# Patient Record
Sex: Female | Born: 2000 | Race: Black or African American | Hispanic: No | Marital: Single | Smoking: Current some day smoker
Health system: Southern US, Community
[De-identification: ages and names within clinical notes are randomized; demographics above are authoritative.]

## PROBLEM LIST (undated history)

## (undated) DIAGNOSIS — D649 Anemia, unspecified: Secondary | ICD-10-CM

## (undated) HISTORY — PX: NO PAST SURGERIES: SHX2092

---
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
## 2019-01-08 ENCOUNTER — Ambulatory Visit: Payer: No Typology Code available for payment source | Admitting: Family Medicine

## 2019-01-14 ENCOUNTER — Ambulatory Visit: Payer: No Typology Code available for payment source | Admitting: Obstetrics & Gynecology

## 2019-01-20 ENCOUNTER — Other Ambulatory Visit: Payer: Self-pay

## 2019-01-20 ENCOUNTER — Ambulatory Visit (INDEPENDENT_AMBULATORY_CARE_PROVIDER_SITE_OTHER): Payer: No Typology Code available for payment source | Admitting: Obstetrics and Gynecology

## 2019-01-20 ENCOUNTER — Encounter: Payer: Self-pay | Admitting: Obstetrics and Gynecology

## 2019-01-20 VITALS — BP 124/68 | HR 74 | Wt 155.8 lb

## 2019-01-20 DIAGNOSIS — Z3202 Encounter for pregnancy test, result negative: Secondary | ICD-10-CM | POA: Diagnosis not present

## 2019-01-20 DIAGNOSIS — Z113 Encounter for screening for infections with a predominantly sexual mode of transmission: Secondary | ICD-10-CM

## 2019-01-20 DIAGNOSIS — Z3046 Encounter for surveillance of implantable subdermal contraceptive: Secondary | ICD-10-CM

## 2019-01-20 LAB — POCT URINE PREGNANCY: Preg Test, Ur: NEGATIVE

## 2019-01-20 MED ORDER — ETONOGESTREL 68 MG ~~LOC~~ IMPL
68.0000 mg | DRUG_IMPLANT | Freq: Once | SUBCUTANEOUS | Status: AC
  Administered 2019-01-20: 68 mg via SUBCUTANEOUS

## 2019-01-20 NOTE — Progress Notes (Signed)
Urine pregnancy negative  

## 2019-01-20 NOTE — Procedures (Signed)
Nexplanon Insertion Procedure Note Patient had Nexplanon for about two years and had it removed about two years ago b/c was in a same sex relationship and didn't need birth control, but states she wished she had left it in b/c it did regulate her periods; she states she had a qmonth period with the nexplanon  And now has irregular periods. Last one was last week of march and was about 6 days and not particularly heavy or painful and one prior was about 6 weeks before. Last sex 3 weeks ago.  Prior to the procedure being performed, the patient (or guardian) was asked to state their full name, date of birth, type of procedure being performed and the exact location of the operative site. This information was then checked against the documentation in the patient's chart. Prior to the procedure being performed, a "time out" was performed by the physician that confirmed the correct patient, procedure and site.  After informed consent was obtained, the patient's non-dominant left arm was chosen for insertion. A site was marked approximately 8 cm proximal to the medial epicondyle in the sulcus between the biceps and triceps on the inner surface. The area was cleaned with alcohol then local anesthesia was infiltrated with 3 ml of 1% lidocaine along the planned insertion track. The area was prepped with betadine. Using sterile technique the Nexplanon device was inserted per manufacturer's guidelines in the subdermal connective tissue using the standard insertion technique without difficulty. Pressure was applied and the insertion site was hemostatic. The presence of the Nexplanon was confirmed immediately after insertion by palpation by both me and the patient and by checking the tip of needle for the absence of the insert.  A pressure dressing was applied.  The patient tolerated the procedure well.  Patient told to wait a week before resuming sex and patient told about possible bleeding profile with nexplanon in  place. Pt amenable to STD screening today.  Cornelia Copa MD Attending Center for [employer] [facility])

## 2019-01-21 LAB — CERVICOVAGINAL ANCILLARY ONLY
Chlamydia: NEGATIVE
Neisseria Gonorrhea: NEGATIVE
Trichomonas: NEGATIVE

## 2019-01-21 LAB — HIV ANTIBODY (ROUTINE TESTING W REFLEX): HIV Screen 4th Generation wRfx: NONREACTIVE

## 2019-01-21 LAB — HEPATITIS C ANTIBODY: Hep C Virus Ab: <0.1 s/co ratio (ref 0.0–0.9)

## 2019-01-21 LAB — RPR: RPR Ser Ql: NONREACTIVE

## 2019-01-21 LAB — HEPATITIS B SURFACE ANTIGEN: Hepatitis B Surface Ag: NEGATIVE

## 2019-01-22 ENCOUNTER — Ambulatory Visit: Payer: No Typology Code available for payment source | Admitting: Family Medicine

## 2019-12-16 ENCOUNTER — Ambulatory Visit: Payer: No Typology Code available for payment source

## 2019-12-17 ENCOUNTER — Ambulatory Visit: Payer: No Typology Code available for payment source

## 2019-12-18 ENCOUNTER — Other Ambulatory Visit: Payer: Self-pay

## 2019-12-18 ENCOUNTER — Ambulatory Visit (INDEPENDENT_AMBULATORY_CARE_PROVIDER_SITE_OTHER): Payer: No Typology Code available for payment source | Admitting: *Deleted

## 2019-12-18 ENCOUNTER — Other Ambulatory Visit (HOSPITAL_COMMUNITY)
Admission: RE | Admit: 2019-12-18 | Discharge: 2019-12-18 | Disposition: A | Discharge status: Home / Self Care | Payer: No Typology Code available for payment source | Source: Ambulatory Visit | Attending: Family Medicine | Admitting: Family Medicine

## 2019-12-18 VITALS — BP 116/73 | HR 91

## 2019-12-18 DIAGNOSIS — N898 Other specified noninflammatory disorders of vagina: Secondary | ICD-10-CM | POA: Insufficient documentation

## 2019-12-18 NOTE — Progress Notes (Signed)
SUBJECTIVE:  19 y.o. female complains of vaginal irritation for a few days. Denies abnormal vaginal bleeding or significant pelvic pain or fever. No UTI symptoms. Denies history of known exposure to STD.  No LMP recorded.  OBJECTIVE:  She appears well, afebrile. Urine dipstick: not done.  ASSESSMENT:  Vaginal Irritation     PLAN:  GC, chlamydia, trichomonas, BVAG, CVAG probe sent to lab. Treatment: To be determined once lab results are received ROV prn if symptoms persist or worsen.

## 2019-12-19 NOTE — Progress Notes (Signed)
Patient seen and assessed by nursing staff.  Agree with documentation and plan.  

## 2019-12-22 ENCOUNTER — Other Ambulatory Visit: Payer: Self-pay | Admitting: Family Medicine

## 2019-12-22 DIAGNOSIS — N898 Other specified noninflammatory disorders of vagina: Secondary | ICD-10-CM

## 2019-12-22 LAB — CERVICOVAGINAL ANCILLARY ONLY
Bacterial Vaginitis (gardnerella): POSITIVE — AB
Candida Glabrata: NEGATIVE
Candida Vaginitis: POSITIVE — AB
Chlamydia: NEGATIVE
Comment: NEGATIVE
Comment: NEGATIVE
Comment: NEGATIVE
Comment: NEGATIVE
Comment: NEGATIVE
Comment: NORMAL
Neisseria Gonorrhea: NEGATIVE
Trichomonas: NEGATIVE

## 2019-12-22 MED ORDER — METRONIDAZOLE 500 MG PO TABS: 500.0000 mg | ORAL_TABLET | Freq: Two times a day (BID) | ORAL | 0 refills | Status: DC

## 2019-12-22 MED ORDER — FLUCONAZOLE 150 MG PO TABS: 150.0000 mg | ORAL_TABLET | Freq: Every day | ORAL | 2 refills | Status: DC

## 2020-03-31 ENCOUNTER — Other Ambulatory Visit: Payer: Self-pay

## 2020-03-31 ENCOUNTER — Emergency Department
Admission: EM | Admit: 2020-03-31 | Discharge: 2020-03-31 | Disposition: A | Discharge status: Home / Self Care | Payer: Self-pay | Attending: Emergency Medicine | Admitting: Emergency Medicine

## 2020-03-31 ENCOUNTER — Emergency Department: Payer: Self-pay

## 2020-03-31 DIAGNOSIS — N3090 Cystitis, unspecified without hematuria: Secondary | ICD-10-CM

## 2020-03-31 DIAGNOSIS — R42 Dizziness and giddiness: Secondary | ICD-10-CM

## 2020-03-31 DIAGNOSIS — R1031 Right lower quadrant pain: Secondary | ICD-10-CM

## 2020-03-31 DIAGNOSIS — R55 Syncope and collapse: Secondary | ICD-10-CM

## 2020-03-31 LAB — URINALYSIS, COMPLETE (UACMP) WITH MICROSCOPIC
Bilirubin Urine: NEGATIVE
Glucose, UA: NEGATIVE mg/dL
Hgb urine dipstick: NEGATIVE
Ketones, ur: NEGATIVE mg/dL
Leukocytes,Ua: NEGATIVE
Nitrite: POSITIVE — AB
Protein, ur: 30 mg/dL — AB
Specific Gravity, Urine: 1.032 — ABNORMAL HIGH (ref 1.005–1.030)
pH: 5 (ref 5.0–8.0)

## 2020-03-31 LAB — BASIC METABOLIC PANEL
Anion gap: 8 (ref 5–15)
BUN: 11 mg/dL (ref 6–20)
CO2: 25 mmol/L (ref 22–32)
Calcium: 8.9 mg/dL (ref 8.9–10.3)
Chloride: 104 mmol/L (ref 98–111)
Creatinine, Ser: 0.94 mg/dL (ref 0.44–1.00)
GFR calc Af Amer: >60 mL/min (ref >60)
GFR calc non Af Amer: >60 mL/min (ref >60)
Glucose, Bld: 90 mg/dL (ref 70–99)
Potassium: 3.8 mmol/L (ref 3.5–5.1)
Sodium: 137 mmol/L (ref 135–145)

## 2020-03-31 LAB — CBC
HCT: 38.8 % (ref 36.0–46.0)
Hemoglobin: 13 g/dL (ref 12.0–15.0)
MCH: 29.3 pg (ref 26.0–34.0)
MCHC: 33.5 g/dL (ref 30.0–36.0)
MCV: 87.6 fL (ref 80.0–100.0)
Platelets: 209 10*3/uL (ref 150–400)
RBC: 4.43 MIL/uL (ref 3.87–5.11)
RDW: 12.5 % (ref 11.5–15.5)
WBC: 5.5 10*3/uL (ref 4.0–10.5)
nRBC: 0 % (ref 0.0–0.2)

## 2020-03-31 LAB — HEPATIC FUNCTION PANEL
ALT: 8 U/L (ref 0–44)
AST: 14 U/L — ABNORMAL LOW (ref 15–41)
Albumin: 4.2 g/dL (ref 3.5–5.0)
Alkaline Phosphatase: 49 U/L (ref 38–126)
Bilirubin, Direct: 0.1 mg/dL (ref 0.0–0.2)
Indirect Bilirubin: 0.8 mg/dL (ref 0.3–0.9)
Total Bilirubin: 0.9 mg/dL (ref 0.3–1.2)
Total Protein: 7.1 g/dL (ref 6.5–8.1)

## 2020-03-31 LAB — HCG, QUANTITATIVE, PREGNANCY: hCG, Beta Chain, Quant, S: <1 m[IU]/mL (ref <5)

## 2020-03-31 LAB — LIPASE, BLOOD: Lipase: 28 U/L (ref 11–51)

## 2020-03-31 MED ORDER — SODIUM CHLORIDE 0.9% FLUSH: 3.0000 mL | Freq: Once | INTRAVENOUS | Status: DC

## 2020-03-31 MED ORDER — ONDANSETRON 4 MG PO TBDP
4.0000 mg | ORAL_TABLET | Freq: Three times a day (TID) | ORAL | 0 refills | Status: DC | PRN
Start: 2020-03-31 — End: 2020-04-08

## 2020-03-31 MED ORDER — SODIUM CHLORIDE 0.9 % IV BOLUS
1000.0000 mL | Freq: Once | INTRAVENOUS | Status: AC
  Administered 2020-03-31: 1000 mL via INTRAVENOUS

## 2020-03-31 MED ORDER — ACETAMINOPHEN 500 MG PO TABS
1000.0000 mg | ORAL_TABLET | Freq: Once | ORAL | Status: AC
  Administered 2020-03-31: 1000 mg via ORAL
  Filled 2020-03-31: qty 2

## 2020-03-31 MED ORDER — IOHEXOL 300 MG/ML  SOLN
100.0000 mL | Freq: Once | INTRAMUSCULAR | Status: AC | PRN
  Administered 2020-03-31: 100 mL via INTRAVENOUS

## 2020-03-31 MED ORDER — CEPHALEXIN 250 MG PO CAPS: 250.0000 mg | ORAL_CAPSULE | Freq: Four times a day (QID) | ORAL | 0 refills | Status: AC

## 2020-03-31 MED ORDER — ONDANSETRON HCL 4 MG/2ML IJ SOLN
4.0000 mg | Freq: Once | INTRAMUSCULAR | Status: AC
  Administered 2020-03-31: 4 mg via INTRAVENOUS
  Filled 2020-03-31: qty 2

## 2020-03-31 NOTE — ED Provider Notes (Signed)
[facility] Emergency Department Provider Note  ____________________________________________   First MD Initiated Contact with Patient 03/31/20 1329     (approximate)  I have reviewed the triage vital signs and the nursing notes.   HISTORY  Chief Complaint Near Syncope    HPI Susan [name] is a 19 y.o. female who comes in for home for abdominal pain and near syncope.  Patient reports having some lower abdominal pain mostly on the right lower side that is intermittent, moderate, nothing makes it better, nothing makes it worse.  She denies ever having any surgeries on her abdomen before.  She states that she has had some associated nausea, vomiting, feeling dehydrated.  This is led her to have some episodes of dizziness where she feels like she is about to pass out.  She denies any shortness of breath or chest pain with it.  Occasionally has some palpitations.  She states that she is not missed any periods.  She denies any vaginal discharge or dysuria.       History reviewed. No pertinent past medical history.  Patient Active Problem List   Diagnosis Date Noted  . Complete miscarriage 12/08/2015    History reviewed. No pertinent surgical history.  Prior to Admission medications   Medication Sig Start Date End Date Taking? Authorizing Provider  diphenhydrAMINE (BENADRYL) 50 MG tablet Take 1 tablet (50 mg total) by mouth at bedtime as needed for itching. 12/28/17   Merrily Brittle, MD  etonogestrel (NEXPLANON) 68 MG IMPL implant 1 each once by Subdermal route.    [provider]  fluconazole (DIFLUCAN) 150 MG tablet Take 1 tablet (150 mg total) by mouth daily. Repeat in 24 hours if needed 12/22/19   Reva Bores, MD  metroNIDAZOLE (FLAGYL) 500 MG tablet Take 1 tablet (500 mg total) by mouth 2 (two) times daily. 12/22/19   Reva Bores, MD  naproxen (NAPROSYN) 500 MG tablet Take 1 tablet (500 mg total) by mouth 2 (two) times daily with a meal. As  needed for pain Patient not taking: Reported on 01/20/2019 12/29/15   Anyanwu, Jethro Bastos, MD    Allergies Benadryl [diphenhydramine] and Coconut flavor  Family History  Problem Relation Age of Onset  . Cancer Mother 33       ovarian cancer  . Asthma Mother   . Hypertension Mother   . Epilepsy Mother   . High Cholesterol Mother   . Heart disease Father   . Hypertension Father   . Thyroid disease Maternal Grandmother   . Cancer Maternal Grandmother   . Heart disease Maternal Grandfather   . Epilepsy Maternal Grandfather   . High Cholesterol Maternal Grandfather   . Stroke Maternal Grandfather   . Hypertension Maternal Grandfather   . Hyperlipidemia Paternal Grandmother   . Hypertension Paternal Grandmother   . Cancer Paternal Grandmother   . Heart disease Paternal Grandmother     Social History Social History   Tobacco Use  . Smoking status: Never Smoker  . Smokeless tobacco: Never Used  Substance Use Topics  . Alcohol use: No  . Drug use: No      Review of Systems Constitutional: No fever/chills, dizziness Eyes: No visual changes. ENT: No sore throat. Cardiovascular: Denies chest pain. Respiratory: Denies shortness of breath. Gastrointestinal: Positive abdominal pain, nausea, vomiting. Genitourinary: Negative for dysuria. Musculoskeletal: Negative for back pain. Skin: Negative for rash. Neurological: Positive headache, no focal weakness or numbness. All other ROS negative ____________________________________________   PHYSICAL EXAM:  VITAL SIGNS: ED Triage Vitals  Enc Vitals Group     BP 03/31/20 1239 110/75     Pulse Rate 03/31/20 1239 86     Resp 03/31/20 1239 18     Temp 03/31/20 1239 98.7 F (37.1 C)     Temp Source 03/31/20 1239 Oral     SpO2 03/31/20 1239 99 %     Weight 03/31/20 1240 163 lb (73.9 kg)     Height 03/31/20 1240 5\' 8"  (1.727 m)     Head Circumference --      Peak Flow --      Pain Score 03/31/20 1239 5     Pain Loc --       Pain Edu? --      Excl. in GC? --     Constitutional: Alert and oriented. Well appearing and in no acute distress. Eyes: Conjunctivae are normal. EOMI. Head: Atraumatic. Nose: No congestion/rhinnorhea. Mouth/Throat: Mucous membranes are moist.   Neck: No stridor. Trachea Midline. FROM Cardiovascular: Normal rate, regular rhythm. Grossly normal heart sounds.  Good peripheral circulation. Respiratory: Normal respiratory effort.  No retractions. Lungs CTAB. Gastrointestinal: Soft but tender mostly in the right lower quadrant.  No distention. No abdominal bruits.  Musculoskeletal: No lower extremity tenderness nor edema.  No joint effusions. Neurologic:  Normal speech and language. No gross focal neurologic deficits are appreciated.  Cranial 2-12 are intact.  Walks with a steady gait. Skin:  Skin is warm, dry and intact. No rash noted. Psychiatric: Mood and affect are normal. Speech and behavior are normal. GU: Deferred   ____________________________________________   LABS (all labs ordered are listed, but only abnormal results are displayed)  Labs Reviewed  BASIC METABOLIC PANEL  CBC  URINALYSIS, COMPLETE (UACMP) WITH MICROSCOPIC  HCG, QUANTITATIVE, PREGNANCY  HEPATIC FUNCTION PANEL  LIPASE, BLOOD  CBG MONITORING, ED  POC URINE PREG, ED   ____________________________________________   ED ECG REPORT I, 06/01/20, the attending physician, personally viewed and interpreted this ECG.  Sinus rate of 79, no ST elevation, T wave inversion in lead III, normal intervals, sinus arrhythmia ____________________________________________  RADIOLOGY Concha Se, personally viewed and evaluated these images (plain radiographs) as part of my medical decision making, as well as reviewing the written report by the radiologist.  ED MD interpretation: Pending  Official radiology report(s): No results found.  ____________________________________________   PROCEDURES  Procedure(s)  performed (including Critical Care):  Procedures   ____________________________________________   INITIAL IMPRESSION / ASSESSMENT AND PLAN / ED COURSE  Susan [name] was evaluated in Emergency Department on 03/31/2020 for the symptoms described in the history of present illness. She was evaluated in the context of the global COVID-19 pandemic, which necessitated consideration that the patient might be at risk for infection with the SARS-CoV-2 virus that causes COVID-19. Institutional protocols and algorithms that pertain to the evaluation of patients at risk for COVID-19 are in a state of rapid change based on information released by regulatory bodies including the CDC and federal and state organizations. These policies and algorithms were followed during the patient's care in the ED.     Patient is a very well-appearing 19 year old with normal vital signs who comes in with right lower quadrant pain, nausea, vomiting, feeling dehydrated.  Also reports a headache as well.  Patient neuro exam is normal she ambulates steadily.  Patient was tender in her right lower quadrant.  Discussed with patient the possibility ovarian versus appendicitis pathology.  We will proceed  with CT imaging to evaluate for both given lower suspicion for ovarian pathology..  Will get pregnancy test.  Will get urine to evaluate for UTI.  She denies concerns for STDs or vaginal symptoms and declines pelvic exam at this time.  She denies any shortness of breath is not tachycardic or hypoxic to suggest PE.   We will give patient Tylenol, Zofran, fluids.  Patient handed off to oncoming team pending CT scan, urine and reevaluation after treatments.  Suspect discharge home if workup is -reassuring.  ____________________________________________   FINAL CLINICAL IMPRESSION(S) / ED DIAGNOSES   Final diagnoses:  Near syncope  Dizziness  Right lower quadrant abdominal pain      MEDICATIONS GIVEN DURING THIS  VISIT:  Medications  sodium chloride flush (NS) 0.9 % injection 3 mL (3 mLs Intravenous Not Given 03/31/20 1345)  sodium chloride 0.9 % bolus 1,000 mL (has no administration in time range)  ondansetron (ZOFRAN) injection 4 mg (has no administration in time range)  acetaminophen (TYLENOL) tablet 1,000 mg (has no administration in time range)     ED Discharge Orders    None       Note:  This document was prepared using Dragon voice recognition software and may include unintentional dictation errors.   Concha Se, MD 03/31/20 [phone]

## 2020-03-31 NOTE — ED Provider Notes (Signed)
CT/abd IMPRESSION: No acute abnormality or findings to account for reported symptoms.  Patient is cleared for outpatient follow-up.   Susan Filbert, MD 03/31/20 1540

## 2020-03-31 NOTE — ED Triage Notes (Signed)
Pt arrives via POV from home for reports of nausea, vomting, dizziness, headache, and lower abdominal pain that started last night. Pt reports an episode of almost passing out at work last night. Pt states the dizziness has mostly subsided today but she still has abdominal pain, headache and nausea. Pt ambulatory from triage with steady gait in NAD, skin warm and dry.

## 2020-03-31 NOTE — Discharge Instructions (Addendum)
Urine concerning for possible UTI-- started on antibiotics. Stay hydrated. Take zofran for nausea.

## 2020-04-03 LAB — URINE CULTURE: Culture: >=100000 — AB

## 2020-04-07 ENCOUNTER — Other Ambulatory Visit (HOSPITAL_COMMUNITY)
Admission: RE | Admit: 2020-04-07 | Discharge: 2020-04-07 | Disposition: A | Discharge status: Home / Self Care | Payer: Medicaid Other | Source: Ambulatory Visit | Attending: Family Medicine | Admitting: Family Medicine

## 2020-04-07 ENCOUNTER — Ambulatory Visit: Payer: Medicaid Other

## 2020-04-07 ENCOUNTER — Other Ambulatory Visit: Payer: Self-pay

## 2020-04-07 VITALS — BP 116/77 | HR 94

## 2020-04-07 DIAGNOSIS — R1031 Right lower quadrant pain: Secondary | ICD-10-CM

## 2020-04-07 NOTE — Progress Notes (Signed)
SUBJECTIVE:  19 y.o. female complains of abdominal pain. Denies abnormal vaginal bleeding or discharge. Pt. Was treated for UTI at [facility] on 03/31/2020. Denies history of known exposure to STD.  LMP: 03/07/2020  OBJECTIVE:  She appears well, afebrile.   ASSESSMENT:  Vaginal Discharge: Denies any vaginal discharge Vaginal Odor: Denies any vaginal odor   PLAN:  GC, chlamydia, trichomonas, BVAG, CVAG probe sent to lab. Treatment: To be determined once lab results are received ROV prn if symptoms persist or worsen.

## 2020-04-08 ENCOUNTER — Ambulatory Visit: Payer: Medicaid Other

## 2020-04-08 ENCOUNTER — Other Ambulatory Visit: Payer: Self-pay | Admitting: Obstetrics & Gynecology

## 2020-04-08 DIAGNOSIS — B3731 Acute candidiasis of vulva and vagina: Secondary | ICD-10-CM

## 2020-04-08 LAB — CERVICOVAGINAL ANCILLARY ONLY
Bacterial Vaginitis (gardnerella): NEGATIVE
Candida Glabrata: NEGATIVE
Candida Vaginitis: POSITIVE — AB
Chlamydia: NEGATIVE
Comment: NEGATIVE
Comment: NEGATIVE
Comment: NEGATIVE
Comment: NEGATIVE
Comment: NEGATIVE
Comment: NORMAL
Neisseria Gonorrhea: NEGATIVE
Trichomonas: NEGATIVE

## 2020-04-08 MED ORDER — FLUCONAZOLE 150 MG PO TABS: 150.0000 mg | ORAL_TABLET | Freq: Once | ORAL | 3 refills | Status: AC

## 2020-04-10 ENCOUNTER — Emergency Department
Admission: EM | Admit: 2020-04-10 | Discharge: 2020-04-10 | Disposition: A | Discharge status: Home / Self Care | Payer: Medicaid Other | Attending: Emergency Medicine | Admitting: Emergency Medicine

## 2020-04-10 ENCOUNTER — Other Ambulatory Visit: Payer: Self-pay

## 2020-04-10 DIAGNOSIS — R1030 Lower abdominal pain, unspecified: Secondary | ICD-10-CM | POA: Insufficient documentation

## 2020-04-10 LAB — COMPREHENSIVE METABOLIC PANEL
ALT: 9 U/L (ref 0–44)
AST: 14 U/L — ABNORMAL LOW (ref 15–41)
Albumin: 4.6 g/dL (ref 3.5–5.0)
Alkaline Phosphatase: 51 U/L (ref 38–126)
Anion gap: 10 (ref 5–15)
BUN: 11 mg/dL (ref 6–20)
CO2: 24 mmol/L (ref 22–32)
Calcium: 9 mg/dL (ref 8.9–10.3)
Chloride: 106 mmol/L (ref 98–111)
Creatinine, Ser: 1.03 mg/dL — ABNORMAL HIGH (ref 0.44–1.00)
GFR calc Af Amer: >60 mL/min (ref >60)
GFR calc non Af Amer: >60 mL/min (ref >60)
Glucose, Bld: 95 mg/dL (ref 70–99)
Potassium: 3.4 mmol/L — ABNORMAL LOW (ref 3.5–5.1)
Sodium: 140 mmol/L (ref 135–145)
Total Bilirubin: 0.8 mg/dL (ref 0.3–1.2)
Total Protein: 7.7 g/dL (ref 6.5–8.1)

## 2020-04-10 LAB — URINALYSIS, COMPLETE (UACMP) WITH MICROSCOPIC
Bilirubin Urine: NEGATIVE
Glucose, UA: NEGATIVE mg/dL
Hgb urine dipstick: NEGATIVE
Ketones, ur: 5 mg/dL — AB
Nitrite: NEGATIVE
Protein, ur: 30 mg/dL — AB
Specific Gravity, Urine: 1.031 — ABNORMAL HIGH (ref 1.005–1.030)
pH: 5 (ref 5.0–8.0)

## 2020-04-10 LAB — LIPASE, BLOOD: Lipase: 37 U/L (ref 11–51)

## 2020-04-10 LAB — CBC
HCT: 38.5 % (ref 36.0–46.0)
Hemoglobin: 12.9 g/dL (ref 12.0–15.0)
MCH: 29.5 pg (ref 26.0–34.0)
MCHC: 33.5 g/dL (ref 30.0–36.0)
MCV: 88.1 fL (ref 80.0–100.0)
Platelets: 242 10*3/uL (ref 150–400)
RBC: 4.37 MIL/uL (ref 3.87–5.11)
RDW: 12.4 % (ref 11.5–15.5)
WBC: 8.1 10*3/uL (ref 4.0–10.5)
nRBC: 0 % (ref 0.0–0.2)

## 2020-04-10 LAB — POCT PREGNANCY, URINE: Preg Test, Ur: NEGATIVE

## 2020-04-10 MED ORDER — NAPROXEN 250 MG PO TABS
250.0000 mg | ORAL_TABLET | Freq: Two times a day (BID) | ORAL | 0 refills | Status: AC
Start: 2020-04-10 — End: 2020-05-10

## 2020-04-10 MED ORDER — NAPROXEN 500 MG PO TABS
250.0000 mg | ORAL_TABLET | Freq: Once | ORAL | Status: AC
  Administered 2020-04-10: 250 mg via ORAL
  Filled 2020-04-10: qty 1

## 2020-04-10 NOTE — ED Triage Notes (Signed)
Patient reports seen for same symptoms 2 weeks ago abdominal pain, with nausea headache have continued and she wants to know what is going on.  Patient in no acute distress.

## 2020-04-10 NOTE — ED Notes (Signed)
Pt unable to sign E-signature due to signature pad malfunction. Pt verbalized understanding of d/c instructions and had no additional questions or concerns for this RN or provider. Pt left with d/c instructions and gathered all personal belongings from room and removed them prior to ED departure.   

## 2020-04-10 NOTE — ED Notes (Signed)
Patient called for a room. Patient states that she has to work tonight so she needs to leave and can not stay to be seen. Patient encouraged to stay at this time to see the MD. Patient states that she will stay to be evaluated.

## 2020-04-10 NOTE — Discharge Instructions (Signed)
Your lab tests and examination today were reassuring. It appears that your urinary tract infection is clearing up. Please continue to follow-up with your doctor as needed.

## 2020-04-10 NOTE — ED Provider Notes (Signed)
[facility] Emergency Department Provider Note  ____________________________________________  Time seen: Approximately 10:19 PM  I have reviewed the triage vital signs and the nursing notes.   HISTORY  Chief Complaint Abdominal Pain    HPI Susan [name] is a 19 y.o. female with no significant past medical history who comes ED complaining of lower abdominal pain for the past week. She was seen in the ED 10 days ago, had labs and CT scan, diagnosed with cystitis and treated with Keflex. She took the Keflex, but felt that her symptoms had not adequately resolved over the weeks, so she followed up with her own doctor. Reviewed electronic medical record which shows that she had additional STI screening which was positive for bacterial vaginosis and Candida vaginitis. She was treated with Flagyl and Diflucan by her primary care clinic. She took these medicines yesterday. She is feeling better, and reports that her pain is resolved, but still feels a little bit off from normal so wanted to be reevaluated today.  No aggravating or alleviating factors, no radiating pain. No vomiting or diarrhea. No constipation. No irregular vaginal bleeding or discharge at this point. Dysuria has improved.      No past medical history on file.   Patient Active Problem List   Diagnosis Date Noted  . Complete miscarriage 12/08/2015     No past surgical history on file.   Prior to Admission medications   Medication Sig Start Date End Date Taking? Authorizing Provider  diphenhydrAMINE (BENADRYL) 50 MG tablet Take 1 tablet (50 mg total) by mouth at bedtime as needed for itching. 12/28/17   Merrily Brittle, MD  etonogestrel (NEXPLANON) 68 MG IMPL implant 1 each once by Subdermal route.    [provider]  naproxen (NAPROSYN) 250 MG tablet Take 1 tablet (250 mg total) by mouth 2 (two) times daily with a meal. 04/10/20 05/10/20  Sharman Cheek, MD     Allergies Coconut  flavor   Family History  Problem Relation Age of Onset  . Cancer Mother 56       ovarian cancer  . Asthma Mother   . Hypertension Mother   . Epilepsy Mother   . High Cholesterol Mother   . Heart disease Father   . Hypertension Father   . Thyroid disease Maternal Grandmother   . Cancer Maternal Grandmother   . Heart disease Maternal Grandfather   . Epilepsy Maternal Grandfather   . High Cholesterol Maternal Grandfather   . Stroke Maternal Grandfather   . Hypertension Maternal Grandfather   . Hyperlipidemia Paternal Grandmother   . Hypertension Paternal Grandmother   . Cancer Paternal Grandmother   . Heart disease Paternal Grandmother     Social History Social History   Tobacco Use  . Smoking status: Never Smoker  . Smokeless tobacco: Never Used  Substance Use Topics  . Alcohol use: No  . Drug use: No    Review of Systems  Constitutional:   No fever or chills.  ENT:   No sore throat. No rhinorrhea. Cardiovascular:   No chest pain or syncope. Respiratory:   No dyspnea or cough. Gastrointestinal: Positive as above for low abdominal pain without vomiting and diarrhea.  Musculoskeletal:   Negative for focal pain or swelling All other systems reviewed and are negative except as documented above in ROS and HPI.  ____________________________________________   PHYSICAL EXAM:  VITAL SIGNS: ED Triage Vitals  Enc Vitals Group     BP 04/10/20 1903 121/86  Pulse Rate 04/10/20 1903 91     Resp 04/10/20 1903 18     Temp 04/10/20 1903 98.3 F (36.8 C)     Temp Source 04/10/20 1903 Oral     SpO2 04/10/20 1903 98 %     Weight 04/10/20 1905 161 lb (73 kg)     Height 04/10/20 1905 5\' 8"  (1.727 m)     Head Circumference --      Peak Flow --      Pain Score 04/10/20 1905 0     Pain Loc --      Pain Edu? --      Excl. in GC? --     Vital signs reviewed, nursing assessments reviewed.   Constitutional:   Alert and oriented. Non-toxic appearance. Eyes:    Conjunctivae are normal. EOMI. PERRL. ENT      Head:   Normocephalic and atraumatic.      Nose:   Wearing a mask.      Mouth/Throat:   Wearing a mask.      Neck:   No meningismus. Full ROM. Hematological/Lymphatic/Immunilogical:   No cervical lymphadenopathy. Cardiovascular:   RRR. Symmetric bilateral radial and DP pulses.  No murmurs. Cap refill less than 2 seconds. Respiratory:   Normal respiratory effort without tachypnea/retractions. Breath sounds are clear and equal bilaterally. No wheezes/rales/rhonchi. Gastrointestinal:   Soft and nontender. Non distended. There is no CVA tenderness.  No rebound, rigidity, or guarding.  Musculoskeletal:   Normal range of motion in all extremities. No joint effusions.  No lower extremity tenderness.  No edema. Neurologic:   Normal speech and language.  Motor grossly intact. No acute focal neurologic deficits are appreciated.  Skin:    Skin is warm, dry and intact. No rash noted.  No petechiae, purpura, or bullae.  ____________________________________________    LABS (pertinent positives/negatives) (all labs ordered are listed, but only abnormal results are displayed) Labs Reviewed  COMPREHENSIVE METABOLIC PANEL - Abnormal; Notable for the following components:      Result Value   Potassium 3.4 (*)    Creatinine, Ser 1.03 (*)    AST 14 (*)    All other components within normal limits  URINALYSIS, COMPLETE (UACMP) WITH MICROSCOPIC - Abnormal; Notable for the following components:   Color, Urine YELLOW (*)    APPearance HAZY (*)    Specific Gravity, Urine 1.031 (*)    Ketones, ur 5 (*)    Protein, ur 30 (*)    Leukocytes,Ua TRACE (*)    Bacteria, UA RARE (*)    All other components within normal limits  LIPASE, BLOOD  CBC  POC URINE PREG, ED  POCT PREGNANCY, URINE   ____________________________________________   EKG    ____________________________________________    RADIOLOGY  No results  found.  ____________________________________________   PROCEDURES Procedures  ____________________________________________    CLINICAL IMPRESSION / ASSESSMENT AND PLAN / ED COURSE  Medications ordered in the ED: Medications  naproxen (NAPROSYN) tablet 250 mg (250 mg Oral Given 04/10/20 2218)    Pertinent labs & imaging results that were available during my care of the patient were reviewed by me and considered in my medical decision making (see chart for details).  SHERRA KIMMONS was evaluated in Emergency Department on 04/10/2020 for the symptoms described in the history of present illness. She was evaluated in the context of the global COVID-19 pandemic, which necessitated consideration that the patient might be at risk for infection with the SARS-CoV-2 virus that causes COVID-19. Institutional  protocols and algorithms that pertain to the evaluation of patients at risk for COVID-19 are in a state of rapid change based on information released by regulatory bodies including the CDC and federal and state organizations. These policies and algorithms were followed during the patient's care in the ED.   Patient is nontoxic, wants to be reevaluated today for persistent abdominal pain in the setting of recent treatment for UTI, bacterial vaginosis, Candida vaginitis. Vital signs are normal, exam is benign. Had a recent CT scan which was unremarkable. Doubt torsion or evolving appendicitis. I think she is stable for continued outpatient follow-up, NSAIDs as needed.      ____________________________________________   FINAL CLINICAL IMPRESSION(S) / ED DIAGNOSES    Final diagnoses:  Lower abdominal pain     ED Discharge Orders         Ordered    naproxen (NAPROSYN) 250 MG tablet  2 times daily with meals     Discontinue  Reprint     04/10/20 2219          Portions of this note were generated with dragon dictation software. Dictation errors may occur despite best attempts at  proofreading.   Sharman Cheek, MD 04/10/20 2221

## 2020-05-12 ENCOUNTER — Emergency Department
Admission: EM | Admit: 2020-05-12 | Discharge: 2020-05-12 | Discharge status: Against Medical Advice | Payer: Medicaid Other

## 2020-06-03 ENCOUNTER — Emergency Department: Payer: Medicaid Other

## 2020-06-03 DIAGNOSIS — L609 Nail disorder, unspecified: Secondary | ICD-10-CM | POA: Insufficient documentation

## 2020-06-03 DIAGNOSIS — Z5321 Procedure and treatment not carried out due to patient leaving prior to being seen by health care provider: Secondary | ICD-10-CM | POA: Insufficient documentation

## 2020-06-03 NOTE — ED Triage Notes (Signed)
Pt comes to ED via POV due to her right big toe nail has separated from nail bed. Pt states that 1 month ago she was walking up stairs and kicked the step causing pain and pt believes she had caused her toenail to separate from nail bed. Pt states she has treated it the last month but starting 2 weeks ago the nail bed turned green and has became more loose. Pt denies pain at the site, site is not red or swollen

## 2020-06-04 ENCOUNTER — Emergency Department
Admission: EM | Admit: 2020-06-04 | Discharge: 2020-06-04 | Disposition: A | Discharge status: Home / Self Care | Payer: Medicaid Other | Attending: Emergency Medicine | Admitting: Emergency Medicine

## 2020-08-05 ENCOUNTER — Ambulatory Visit: Payer: Medicaid Other | Admitting: Obstetrics and Gynecology

## 2020-08-11 ENCOUNTER — Encounter: Payer: Self-pay | Admitting: Family Medicine

## 2020-08-11 ENCOUNTER — Other Ambulatory Visit (HOSPITAL_COMMUNITY)
Admission: RE | Admit: 2020-08-11 | Discharge: 2020-08-11 | Disposition: A | Discharge status: Home / Self Care | Payer: Medicaid Other | Source: Ambulatory Visit | Attending: Family Medicine | Admitting: Family Medicine

## 2020-08-11 ENCOUNTER — Other Ambulatory Visit: Payer: Self-pay

## 2020-08-11 ENCOUNTER — Ambulatory Visit (INDEPENDENT_AMBULATORY_CARE_PROVIDER_SITE_OTHER): Payer: Self-pay | Admitting: Family Medicine

## 2020-08-11 VITALS — BP 115/75 | HR 71 | Wt 130.0 lb

## 2020-08-11 DIAGNOSIS — B3731 Acute candidiasis of vulva and vagina: Secondary | ICD-10-CM

## 2020-08-11 DIAGNOSIS — B373 Candidiasis of vulva and vagina: Secondary | ICD-10-CM

## 2020-08-11 MED ORDER — FLUCONAZOLE 150 MG PO TABS: 150.0000 mg | ORAL_TABLET | Freq: Once | ORAL | 3 refills | Status: AC

## 2020-08-11 NOTE — Progress Notes (Signed)
Recurrent yeast infections

## 2020-08-11 NOTE — Progress Notes (Signed)
   GYNECOLOGY PROBLEM  VISIT ENCOUNTER NOTE  Subjective:   Susan [name] is a 19 y.o. [id number] female here for a a problem GYN visit.  Current complaints: vaginal irritation-- happens every 3-4 months. Notices irritation and mild itching but minimal discharge. Denies odor. No new partners since last visit in July. Reports stinging with urination at the end. Has used the diflucan. She does wax, but has not recently. Use summers eve wipes occasionally and summer eve wash externally. Showers every 2days. No change sin soaps. No aggravating and no alleviating. Reports no provoking factors.  Denies abnormal vaginal bleeding, discharge, pelvic pain, problems with intercourse or other gynecologic concerns.    Gynecologic History No LMP recorded. Contraception: Nexplanon  Health Maintenance Due  Topic Date Due  . TETANUS/TDAP  Never done  . INFLUENZA VACCINE  Never done     The following portions of the patient's history were reviewed and updated as appropriate: allergies, current medications, past family history, past medical history, past social history, past surgical history and problem list.  Review of Systems Pertinent items are noted in HPI.   Objective:  BP 115/75   Pulse 71   Wt 130 lb (59 kg)   BMI 19.77 kg/m  Gen: well appearing, NAD HEENT: no scleral icterus CV: RR Lung: Normal WOB Ext: warm well perfused  PELVIC: White film on lower vestibule and between labia minora/majora bilaterally. Small fissure present on lower perineum.  normal appearing vaginal mucosa and cervix.  No abnormal discharge noted.  Normal uterine size, no other palpable masses, no uterine or adnexal tenderness.   Assessment and Plan:  1. Candida vaginitis Recommend establishing healthier vaginal flora Discussed cotton underwear, sleeping without underwear, use of probiotics and apple cider vinegar baths Recommended to continue to avoid waxing until treatment is complete an sx resolve.  -  fluconazole (DIFLUCAN) 150 MG tablet; Take 1 tablet (150 mg total) by mouth once for 1 dose. Can take additional dose three days later if symptoms persist  Dispense: 1 tablet; Refill: 3 - Cervicovaginal ancillary only( [facility])   Please refer to After Visit Summary for other counseling recommendations.   No follow-ups on file.  Federico Flake, MD, MPH, ABFM Attending Physician Faculty Practice- [facility]

## 2020-08-11 NOTE — Patient Instructions (Addendum)
Natural Remedies for Vaginal sx  Option #1 1 Tbsp Fractitionated Coconut Oil 10 drops of Melaleuca (Tea Tree) Oil  Mix ingredients together well.  Soak 3-4 tampons (in applicators) in that mixture until all or mostly all mixture is soaked up into the tampons.  Insert 1 saturated tampon vaginally and wear overnight for 3-4 nights.    Option #2 (sometimes to be used in conjunction with option #1) Fill tub with enough to cover lap/lower abdomen warm water.  Mix 1/2 cup of baking soda in water.  Soak in water/baking soda mixture for at least 20 minutes.  Be sure to swish water in between legs to get as much in vagina as possible.  This soak should be done after sexual intercourse and menstrual cycles.    Option#3 (can use with #1 or in place of #2) Fill tub with enough to cover lap/lower abdomen warm water. Mix 2-4 cups of apple cider vinegar into the water. Be sure to swish water in between legs to get as much in vagina as possible.  This soak should be done after sexual intercourse and menstrual cycles.    Reestablish good bacteria (Lactobacillus)! You can do this by using probiotic 1) Culturelle or Store Brand 2) Places that whole food often have supplements with health bacteria 3) Foods with natural probiotics- yogurt, kefir, kombucha, fermented cabbage like saurerkruat and kim chi  GO WHITE: Soap: UNSCENTED Dove (white box light green writing) Laundry detergent (underwear)- Dreft or [employer] unscented WHITE 100% cotton panties (NOT just cotton crouch) Sanitary napkin/panty liners: UNSCENTED.  If it doesn't SAY unscented it can have a scent/perfume    NO PERFUMES OR LOTIONS OR POTIONS in the vulvar area (may use regular [place]) Condoms: hypoallergenic only. Non dyed (no color) Toilet papers: white only Wash clothes: use a separate wash cloth. WHITE.  Wash in [place].   You can purchase Tea Tree Oil locally at:  [employer] [address] [place] [address]  [employer] [address] [place] [address]  Advise that these alternatives will not replace the need to be evaluated if symptoms persist. You will need to seek care at an OB/GYN provider.

## 2020-08-13 LAB — CERVICOVAGINAL ANCILLARY ONLY
Bacterial Vaginitis (gardnerella): NEGATIVE
Candida Glabrata: NEGATIVE
Candida Vaginitis: POSITIVE — AB
Chlamydia: NEGATIVE
Comment: NEGATIVE
Comment: NEGATIVE
Comment: NEGATIVE
Comment: NEGATIVE
Comment: NEGATIVE
Comment: NORMAL
Neisseria Gonorrhea: NEGATIVE
Trichomonas: NEGATIVE

## 2020-09-29 ENCOUNTER — Ambulatory Visit: Payer: Medicaid Other

## 2020-09-30 ENCOUNTER — Ambulatory Visit: Payer: Medicaid Other

## 2020-10-04 ENCOUNTER — Ambulatory Visit (INDEPENDENT_AMBULATORY_CARE_PROVIDER_SITE_OTHER): Payer: Self-pay | Admitting: *Deleted

## 2020-10-04 ENCOUNTER — Other Ambulatory Visit (HOSPITAL_COMMUNITY)
Admission: RE | Admit: 2020-10-04 | Discharge: 2020-10-04 | Disposition: A | Discharge status: Home / Self Care | Payer: Medicaid Other | Source: Ambulatory Visit | Attending: Obstetrics & Gynecology | Admitting: Obstetrics & Gynecology

## 2020-10-04 ENCOUNTER — Other Ambulatory Visit: Payer: Self-pay

## 2020-10-04 VITALS — BP 110/73 | HR 75

## 2020-10-04 DIAGNOSIS — B373 Candidiasis of vulva and vagina: Secondary | ICD-10-CM

## 2020-10-04 DIAGNOSIS — B3731 Acute candidiasis of vulva and vagina: Secondary | ICD-10-CM

## 2020-10-04 DIAGNOSIS — N898 Other specified noninflammatory disorders of vagina: Secondary | ICD-10-CM | POA: Diagnosis not present

## 2020-10-04 NOTE — Progress Notes (Signed)
SUBJECTIVE:  20 y.o. female complains of white vaginal discharge and irritation for few day(s). Denies abnormal vaginal bleeding or significant pelvic pain or fever. No UTI symptoms. Denies history of known exposure to STD.  No LMP recorded.  OBJECTIVE:  She appears well, afebrile. Urine dipstick: not done.  ASSESSMENT:  Vaginal Discharge  Vaginal Irritation    PLAN:  BVAG, CVAG probe sent to lab. Treatment: To be determined once lab results are received ROV prn if symptoms persist or worsen.

## 2020-10-05 NOTE — Progress Notes (Signed)
Patient was assessed and managed by nursing staff during this encounter. I have reviewed the chart and agree with the documentation and plan. I have also made any necessary editorial changes.  Jaynie Collins, MD 10/05/2020 9:22 AM

## 2020-10-06 LAB — CERVICOVAGINAL ANCILLARY ONLY
Bacterial Vaginitis (gardnerella): NEGATIVE
Candida Glabrata: NEGATIVE
Candida Vaginitis: POSITIVE — AB
Comment: NEGATIVE
Comment: NEGATIVE
Comment: NEGATIVE

## 2020-10-06 MED ORDER — FLUCONAZOLE 150 MG PO TABS: 150.0000 mg | ORAL_TABLET | Freq: Once | ORAL | 3 refills | Status: AC

## 2020-10-06 NOTE — Addendum Note (Signed)
Addended by: Jaynie Collins A on: 10/06/2020 10:19 PM   Modules accepted: Orders

## 2020-12-07 ENCOUNTER — Ambulatory Visit: Payer: Medicaid Other

## 2020-12-09 ENCOUNTER — Other Ambulatory Visit (HOSPITAL_COMMUNITY)
Admission: RE | Admit: 2020-12-09 | Discharge: 2020-12-09 | Disposition: A | Discharge status: Home / Self Care | Payer: Medicaid Other | Source: Ambulatory Visit | Attending: Obstetrics & Gynecology | Admitting: Obstetrics & Gynecology

## 2020-12-09 ENCOUNTER — Other Ambulatory Visit: Payer: Self-pay

## 2020-12-09 ENCOUNTER — Ambulatory Visit (INDEPENDENT_AMBULATORY_CARE_PROVIDER_SITE_OTHER): Payer: Self-pay

## 2020-12-09 VITALS — BP 100/69 | HR 76

## 2020-12-09 DIAGNOSIS — B3731 Acute candidiasis of vulva and vagina: Secondary | ICD-10-CM

## 2020-12-09 DIAGNOSIS — N898 Other specified noninflammatory disorders of vagina: Secondary | ICD-10-CM | POA: Insufficient documentation

## 2020-12-09 DIAGNOSIS — B373 Candidiasis of vulva and vagina: Secondary | ICD-10-CM

## 2020-12-09 NOTE — Progress Notes (Signed)
SUBJECTIVE:  20 y.o. female complains of vaginal irritation for 4 days with a white discharge.    Denies abnormal vaginal bleeding or significant pelvic pain or fever. No UTI symptoms. Denies history of known exposure to STD.  No LMP recorded.  OBJECTIVE:  She appears well, afebrile. Urine dipstick: not done.  ASSESSMENT:  Vaginal Discharge     PLAN:  GC, chlamydia, trichomonas, BVAG, CVAG probe sent to lab. Treatment: To be determined once lab results are received ROV prn if symptoms persist or worsen.

## 2020-12-09 NOTE — Progress Notes (Signed)
Patient was assessed and managed by nursing staff during this encounter. I have reviewed the chart and agree with the documentation and plan. I have also made any necessary editorial changes.  Conna Terada, MD 12/09/2020 4:33 PM 

## 2020-12-10 LAB — CERVICOVAGINAL ANCILLARY ONLY
Bacterial Vaginitis (gardnerella): NEGATIVE
Candida Glabrata: NEGATIVE
Candida Vaginitis: POSITIVE — AB
Chlamydia: NEGATIVE
Comment: NEGATIVE
Comment: NEGATIVE
Comment: NEGATIVE
Comment: NEGATIVE
Comment: NEGATIVE
Comment: NORMAL
Neisseria Gonorrhea: NEGATIVE
Trichomonas: NEGATIVE

## 2020-12-12 MED ORDER — FLUCONAZOLE 150 MG PO TABS: 150.0000 mg | ORAL_TABLET | Freq: Once | ORAL | 3 refills | Status: DC

## 2020-12-12 NOTE — Addendum Note (Signed)
Addended by: Jaynie Collins A on: 12/12/2020 11:36 AM   Modules accepted: Orders

## 2021-03-21 IMAGING — CT CT ABD-PELV W/ CM
2 of 4 series · 17 of 46 positions shown, 19 images · IV contrast (omnipaque)
Comparison: None.

CLINICAL DATA: Abdominal pain

EXAM:
CT ABDOMEN AND PELVIS WITH CONTRAST
TECHNIQUE: Multidetector CT imaging of the abdomen and pelvis was performed
using the standard protocol following bolus administration of
intravenous contrast.
CONTRAST:  100mL OMNIPAQUE IOHEXOL 300 MG/ML  SOLN

[Series 2: routine abd/pel with · axial · 0.70mm/px · z∈[-531,-121]mm · 14 of 90 slices shown, 16 images]
[im 4/90  soft-tissue]
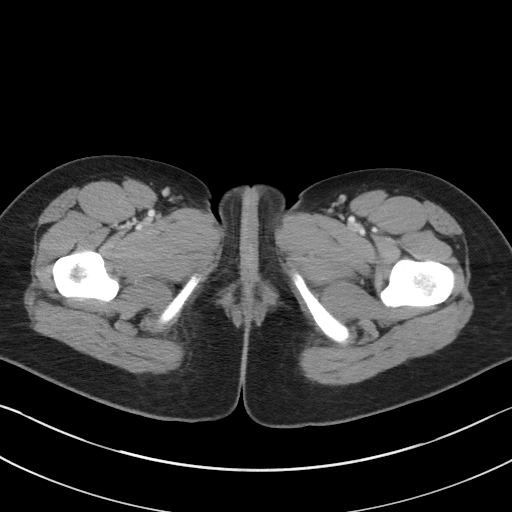
[im 4/90  bone]
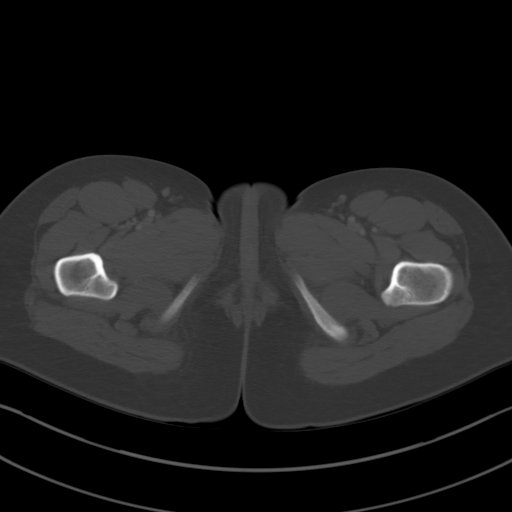
[im 12/90  soft-tissue]
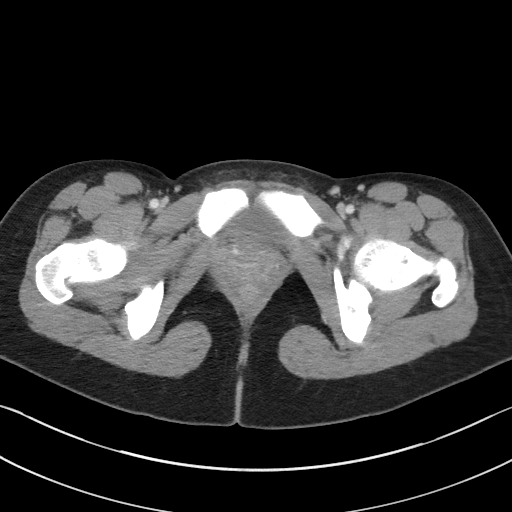
[im 19/90  soft-tissue]
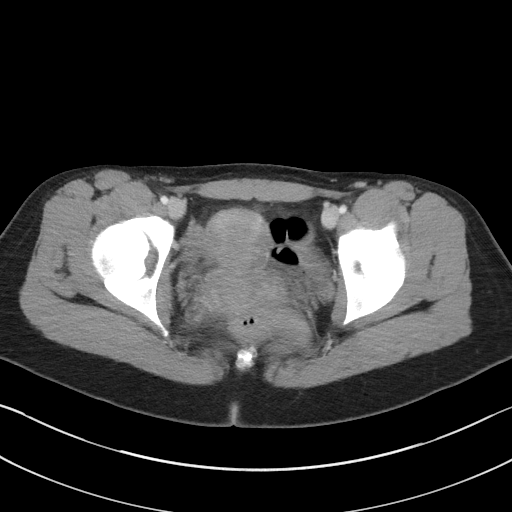
[im 23/90  soft-tissue]
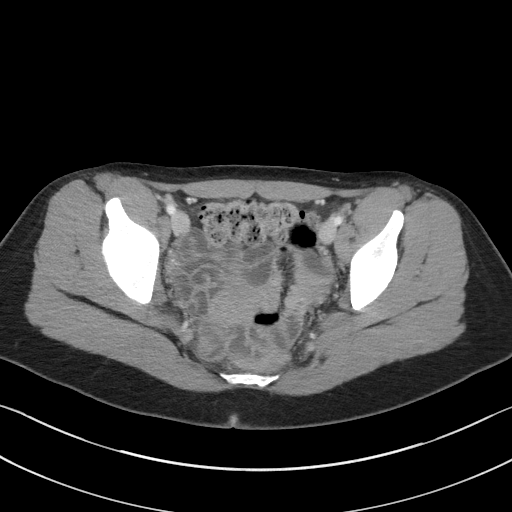
[im 30/90  soft-tissue]
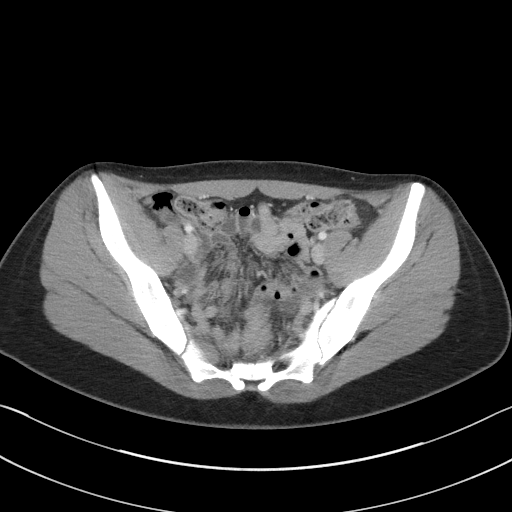
[im 38/90  soft-tissue]
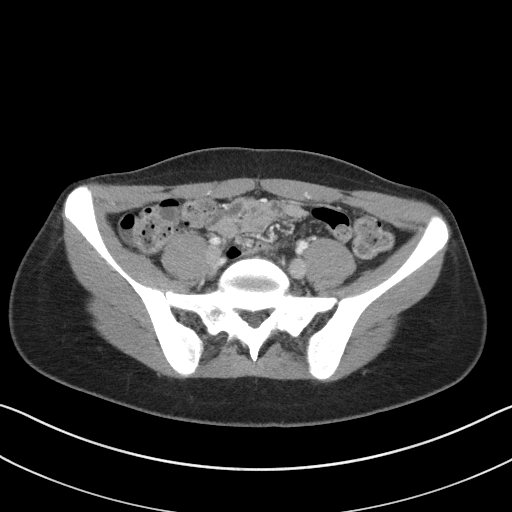
[im 41/90  soft-tissue]
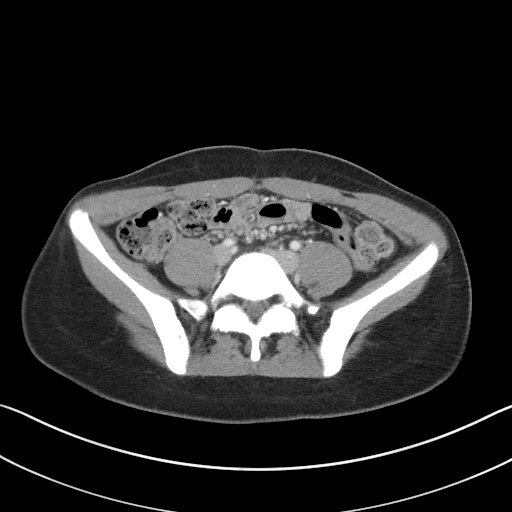
[im 49/90  soft-tissue]
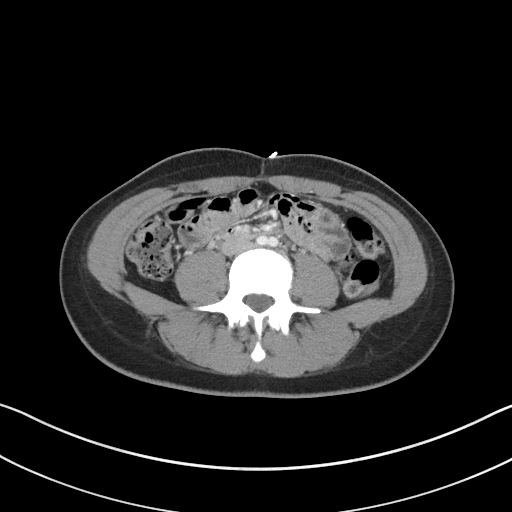
[im 52/90  soft-tissue]
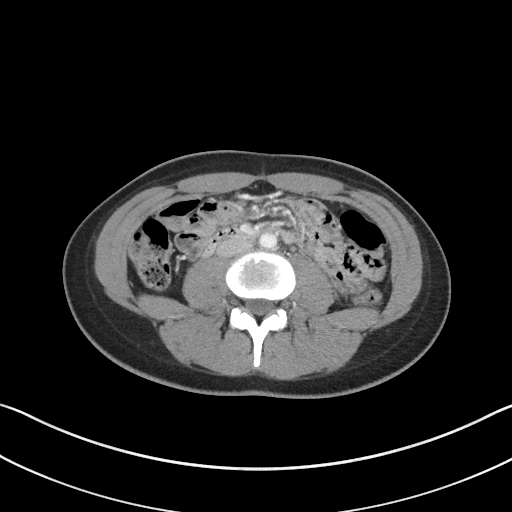
[im 52/90  bone]
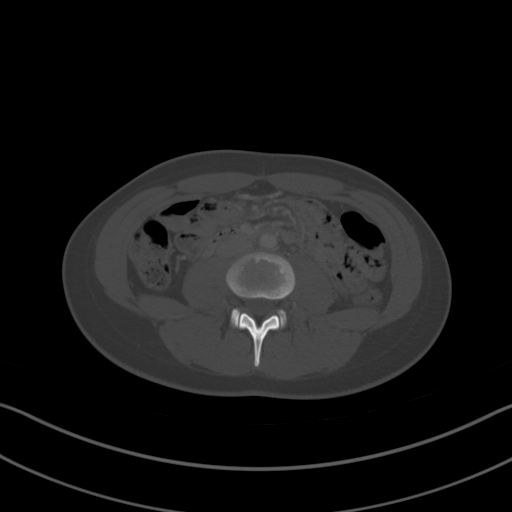
[im 60/90  soft-tissue]
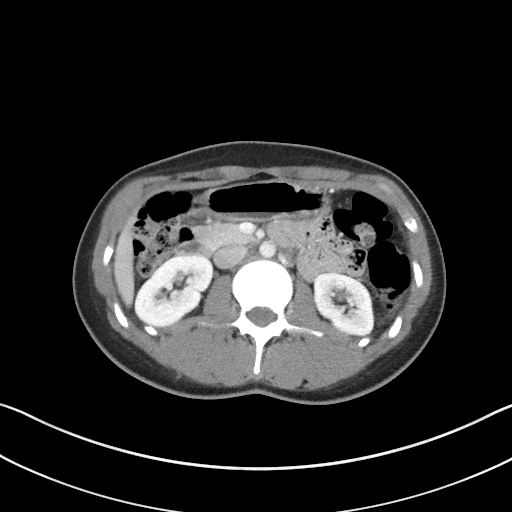
[im 67/90  soft-tissue]
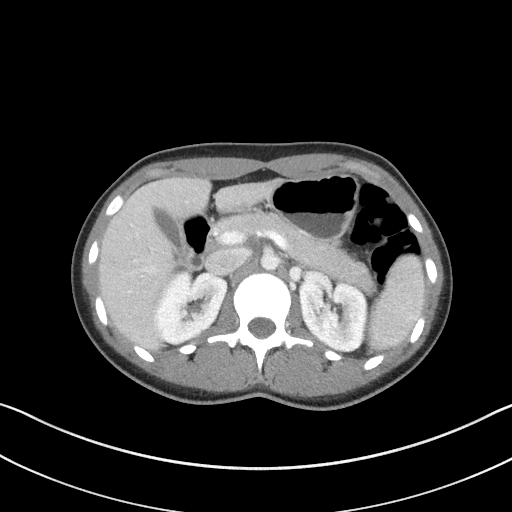
[im 71/90  soft-tissue]
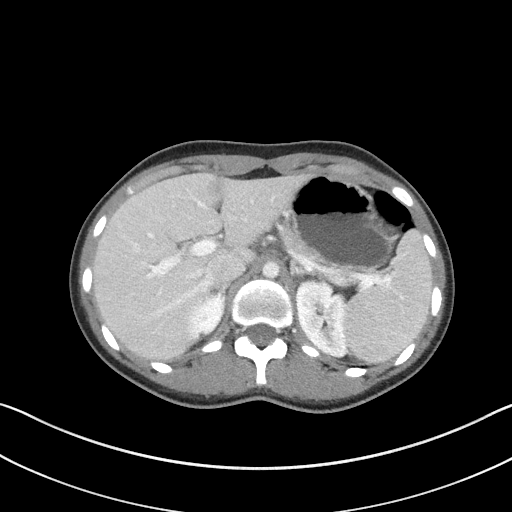
[im 78/90  soft-tissue]
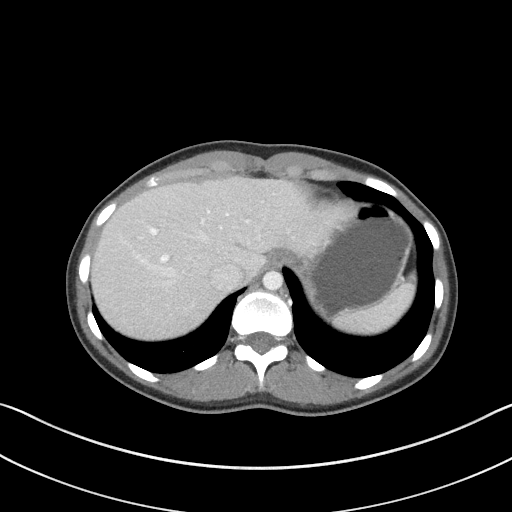
[im 86/90  soft-tissue]
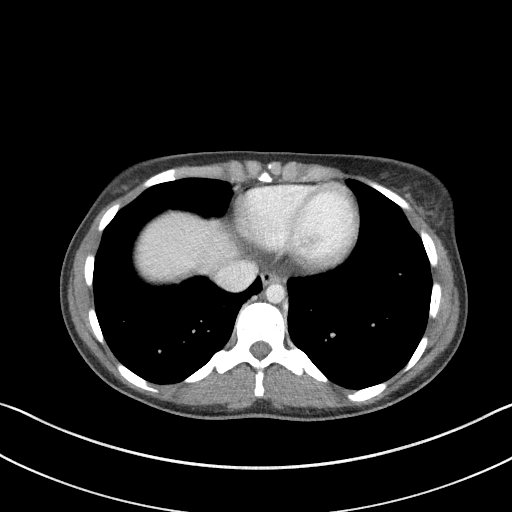

[Series 5: coronal st · coronal · 0.79mm/px · 3 of 63 slices shown]
[im 21/63  soft-tissue]
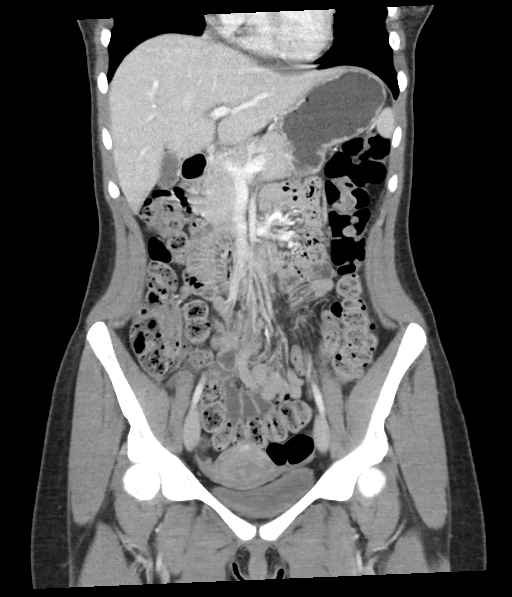
[im 28/63  soft-tissue]
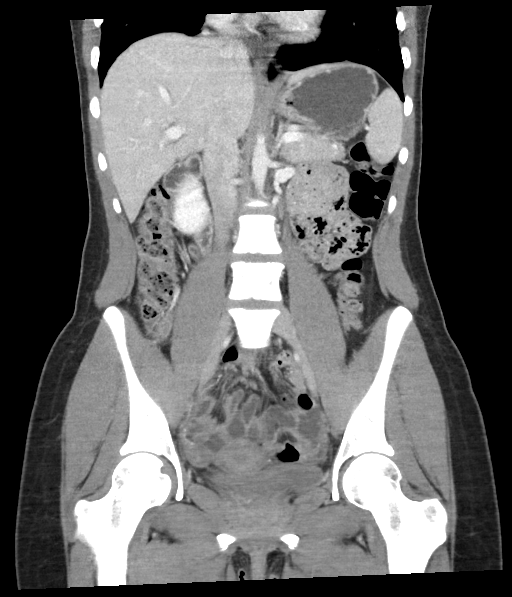
[im 35/63  soft-tissue]
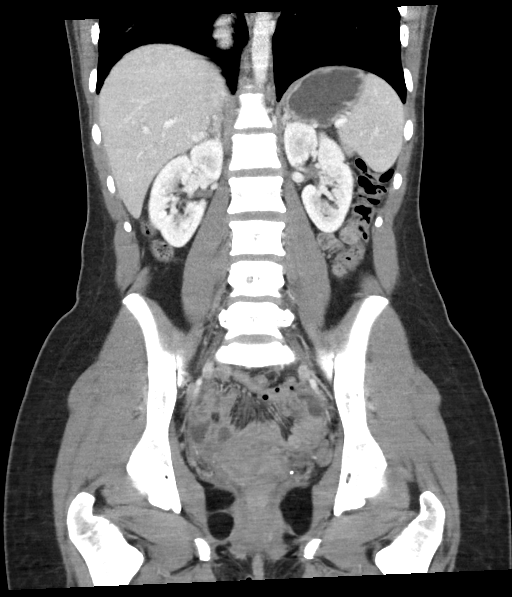

[17 of 46 positions shown; findings below may reference images not displayed]

FINDINGS: Lower chest: No acute abnormality.

Hepatobiliary: No focal liver abnormality is seen. No gallstones,
gallbladder wall thickening, or biliary dilatation.

Pancreas: Unremarkable.

Spleen: Unremarkable.

Adrenals/Urinary Tract: Adrenals, kidneys, and bladder are
unremarkable.

Stomach/Bowel: Stomach is within normal limits. Bowel is normal in
caliber. Normal appendix.

Vascular/Lymphatic: No significant vascular findings are present.
There are no enlarged lymph nodes identified.

Reproductive: Uterus and bilateral adnexa are unremarkable.

Other: No ascites.  Abdominal wall is unremarkable.

Musculoskeletal: No significant or acute osseous abnormality.
IMPRESSION: No acute abnormality or findings to account for reported symptoms.

## 2021-08-25 ENCOUNTER — Ambulatory Visit: Payer: Medicaid Other | Admitting: Obstetrics and Gynecology

## 2021-09-01 ENCOUNTER — Ambulatory Visit: Payer: Medicaid Other | Admitting: Obstetrics and Gynecology

## 2021-09-27 ENCOUNTER — Ambulatory Visit: Payer: Medicaid Other | Admitting: Obstetrics and Gynecology

## 2021-10-01 ENCOUNTER — Other Ambulatory Visit: Payer: Self-pay | Admitting: Family Medicine

## 2021-10-01 DIAGNOSIS — B3731 Acute candidiasis of vulva and vagina: Secondary | ICD-10-CM

## 2021-10-03 ENCOUNTER — Other Ambulatory Visit: Payer: Self-pay

## 2021-10-03 DIAGNOSIS — B3731 Acute candidiasis of vulva and vagina: Secondary | ICD-10-CM

## 2021-10-03 DIAGNOSIS — N898 Other specified noninflammatory disorders of vagina: Secondary | ICD-10-CM

## 2021-10-03 MED ORDER — FLUCONAZOLE 150 MG PO TABS: 150.0000 mg | ORAL_TABLET | Freq: Once | ORAL | 3 refills | Status: AC

## 2021-10-03 NOTE — Progress Notes (Signed)
Pt called requesting Rx for sx's of yeast Rx sent per protocol  Pt has upcoming appt next month.

## 2021-10-11 ENCOUNTER — Ambulatory Visit: Payer: Medicaid Other

## 2021-11-03 ENCOUNTER — Ambulatory Visit: Payer: Medicaid Other | Admitting: Advanced Practice Midwife

## 2021-12-13 ENCOUNTER — Ambulatory Visit (INDEPENDENT_AMBULATORY_CARE_PROVIDER_SITE_OTHER): Payer: Medicaid Other | Admitting: Obstetrics & Gynecology

## 2021-12-13 ENCOUNTER — Other Ambulatory Visit: Payer: Self-pay

## 2021-12-13 ENCOUNTER — Other Ambulatory Visit (HOSPITAL_COMMUNITY)
Admission: RE | Admit: 2021-12-13 | Discharge: 2021-12-13 | Disposition: A | Discharge status: Home / Self Care | Payer: Medicaid Other | Source: Ambulatory Visit | Attending: Obstetrics & Gynecology | Admitting: Obstetrics & Gynecology

## 2021-12-13 ENCOUNTER — Encounter: Payer: Self-pay | Admitting: Obstetrics & Gynecology

## 2021-12-13 ENCOUNTER — Telehealth: Payer: Self-pay

## 2021-12-13 VITALS — BP 110/69 | HR 87 | Ht 68.0 in | Wt 132.4 lb

## 2021-12-13 DIAGNOSIS — Z7185 Encounter for immunization safety counseling: Secondary | ICD-10-CM | POA: Diagnosis not present

## 2021-12-13 DIAGNOSIS — Z30431 Encounter for routine checking of intrauterine contraceptive device: Secondary | ICD-10-CM

## 2021-12-13 DIAGNOSIS — Z01419 Encounter for gynecological examination (general) (routine) without abnormal findings: Secondary | ICD-10-CM

## 2021-12-13 DIAGNOSIS — Z0000 Encounter for general adult medical examination without abnormal findings: Secondary | ICD-10-CM | POA: Diagnosis not present

## 2021-12-13 DIAGNOSIS — Z113 Encounter for screening for infections with a predominantly sexual mode of transmission: Secondary | ICD-10-CM

## 2021-12-13 DIAGNOSIS — Z975 Presence of (intrauterine) contraceptive device: Secondary | ICD-10-CM

## 2021-12-13 NOTE — Telephone Encounter (Signed)
Spoke with patients mom Artrina to schedule her f/u appointment for nexplanon removal. Pt is currently sleeping. Mom will advise her of appointment day and time.  ?

## 2021-12-13 NOTE — Progress Notes (Signed)
? ? ?GYNECOLOGY ANNUAL PREVENTATIVE CARE ENCOUNTER NOTE ? ?History:    ? Susan [name] is a 21 y.o. G67P0010 female here for a routine annual gynecologic exam.  Current complaints: none.   Denies abnormal vaginal bleeding, discharge, pelvic pain, problems with intercourse or other gynecologic concerns.  ?  ?Gynecologic History ?Patient's last menstrual period was 12/06/2021 (exact date). ?Contraception: Nexplanon placed 01/20/2019 ?HPV vaccine series: Unsure if she received ? ? ?Obstetric History ?OB History  ?Gravida Para Term Preterm AB Living  ?1 0 0 0 1 0  ?SAB IAB Ectopic Multiple Live Births  ?1 0 0 0 0  ?  ?# Outcome Date GA Lbr Len/2nd Weight Sex Delivery Anes PTL Lv  ?1 SAB      SAB     ? ? ?History reviewed. No pertinent past medical history. ? ?History reviewed. No pertinent surgical history. ? ?Current Outpatient Medications on File Prior to Visit  ?Medication Sig Dispense Refill  ? etonogestrel (NEXPLANON) 68 MG IMPL implant 1 each once by Subdermal route.    ? EPINEPHrine 0.3 mg/0.3 mL IJ SOAJ injection Inject 0.3 mL (0.3 mg total) as directed 1 (one) time if needed for anaphylaxis for up to 1 dose. Inject into upper leg. Call 911 after use. (Patient not taking: Reported on 12/13/2021)    ? fluconazole (DIFLUCAN) 150 MG tablet Take by mouth. (Patient not taking: Reported on 12/13/2021)    ? ?No current facility-administered medications on file prior to visit.  ? ? ?No Known Allergies ? ?Social History:  reports that she has never smoked. She has never used smokeless tobacco. She reports that she does not drink alcohol and does not use drugs. ? ?Family History  ?Problem Relation Age of Onset  ? Hyperlipidemia Paternal Grandmother   ? Hypertension Paternal Grandmother   ? Cancer Paternal Grandmother   ? Heart disease Paternal Grandmother   ? Thyroid disease Maternal Grandmother   ? Cancer Maternal Grandmother   ? Heart disease Maternal Grandfather   ? Epilepsy Maternal Grandfather   ? High Cholesterol  Maternal Grandfather   ? Stroke Maternal Grandfather   ? Hypertension Maternal Grandfather   ? Heart disease Father   ? Hypertension Father   ? Cancer Mother 17  ?     ovarian cancer  ? Asthma Mother   ? Hypertension Mother   ? Epilepsy Mother   ? High Cholesterol Mother   ? ? ?The following portions of the patient's history were reviewed and updated as appropriate: allergies, current medications, past family history, past medical history, past social history, past surgical history and problem list. ? ?Review of Systems ?Pertinent items noted in HPI and remainder of comprehensive ROS otherwise negative. ? ?Physical Exam:  ?BP 110/69 (BP Location: Left Arm, Patient Position: Sitting, Cuff Size: Normal)   Pulse 87   Ht 5\' 8"  (1.727 m)   Wt 132 lb 6.4 oz (60.1 kg)   LMP 12/06/2021 (Exact Date)   BMI 20.13 kg/m?  ?CONSTITUTIONAL: Well-developed, well-nourished female in no acute distress.  ?HENT:  Normocephalic, atraumatic, External right and left ear normal.  ?EYES: Conjunctivae and EOM are normal. Pupils are equal, round, and reactive to light. No scleral icterus.  ?NECK: Normal range of motion, supple, no masses.  Normal thyroid.  ?SKIN: Skin is warm and dry. No rash noted. Not diaphoretic. No erythema. No pallor. ?MUSCULOSKELETAL: Normal range of motion. No tenderness.  No cyanosis, clubbing, or edema. ?NEUROLOGIC: Alert and oriented to person, place, and time.  Normal reflexes, muscle tone coordination.  ?PSYCHIATRIC: Normal mood and affect. Normal behavior. Normal judgment and thought content. ?CARDIOVASCULAR: Normal heart rate noted, regular rhythm ?RESPIRATORY: Clear to auscultation bilaterally. Effort and breath sounds normal, no problems with respiration noted. ?BREASTS: Symmetric in size. No masses, tenderness, skin changes, nipple drainage, or lymphadenopathy bilaterally. Performed in the presence of a chaperone. ?ABDOMEN: Soft, no distention noted.  No tenderness, rebound or guarding.  ?PELVIC: Normal  appearing external genitalia and urethral meatus; normal appearing vaginal mucosa and cervix.  Bloody discharge noted, at end of period.  Pap smear obtained.  Normal uterine size, no other palpable masses, no uterine or adnexal tenderness.  Performed in the presence of a chaperone. ?  ?Assessment and Plan:  ? 1. Nexplanon in place since 01/20/2019 ?Can unofficially be in place for up to 4 years, FDA says 3 years. Will remove next month. ?She is planning to get pregnant afterwards, she is taking mutivitamins with folic acid.  ? ?2. Routine screening for STI (sexually transmitted infection) ?Negative GC/Chlam/Trich on 12/03/2021, serum testing done.  Will follow up results and manage accordingly. ?Safe sex recommended at all times. ?- RPR+HBsAg+HCVAb+HIV ? ?3. HPV vaccine counseling ?Unsure if she got this yet, will ask mother. If not, she will consider and let us know. ? ?4. Well woman exam with routine gynecological exam ?- Cytology - PAP( [place]) ?Will follow up results of pap smear and manage accordingly. ?Routine preventative health maintenance measures emphasized. ?Please refer to After Visit Summary for other counseling recommendations.  ?   ? ?Verita Schneiders, MD, FACOG ?Obstetrician Social research officer, government, Faculty Practice ?Center for [place] ? ?

## 2021-12-14 LAB — RPR+HBSAG+HCVAB+...
HIV Screen 4th Generation wRfx: NONREACTIVE
Hep C Virus Ab: NONREACTIVE
Hepatitis B Surface Ag: NEGATIVE
RPR Ser Ql: NONREACTIVE

## 2021-12-16 ENCOUNTER — Encounter: Payer: Self-pay | Admitting: Obstetrics & Gynecology

## 2021-12-16 ENCOUNTER — Telehealth: Payer: Self-pay

## 2021-12-16 DIAGNOSIS — R87612 Low grade squamous intraepithelial lesion on cytologic smear of cervix (LGSIL): Secondary | ICD-10-CM | POA: Insufficient documentation

## 2021-12-16 NOTE — Telephone Encounter (Signed)
Patient called in demanding to speak to a nurse asap due to test results she viewed in [place]. Advised pt that nurse is currently with a pt and I can send a message back to her or she can also send a message via mychart regarding her concerns. Pt then stated never mind and hung up.  ?

## 2021-12-19 LAB — CYTOLOGY - PAP
Comment: NEGATIVE
High risk HPV: NEGATIVE

## 2022-01-19 ENCOUNTER — Ambulatory Visit: Payer: Self-pay | Admitting: Obstetrics & Gynecology

## 2022-01-21 ENCOUNTER — Other Ambulatory Visit: Payer: Self-pay | Admitting: Family Medicine

## 2022-01-23 ENCOUNTER — Ambulatory Visit: Payer: Self-pay | Admitting: Family Medicine

## 2022-01-23 NOTE — Progress Notes (Deleted)
     GYNECOLOGY CLINIC PROCEDURE NOTE  Susan [name] is a 21 y.o. G1P0010 here for Nexplanon removal*** Nexplanon insertion.  Last pap smear was on *** and was normal.  No other gynecologic concerns.  Nexplanon Removal Patient identified, informed consent performed, consent signed.   Appropriate time out taken. Nexplanon site identified.  Area prepped in usual sterile fashon. One ml of 1% lidocaine was used to anesthetize the area at the distal end of the implant. A small stab incision was made right beside the implant on the distal portion.  The Nexplanon rod was grasped using hemostats and removed without difficulty.  There was minimal blood loss. There were no complications.  3 ml of 1% lidocaine was injected around the incision for post-procedure analgesia.  Steri-strips were applied over the small incision.  A pressure bandage was applied to reduce any bruising.  The patient tolerated the procedure well and was given post procedure instructions.  Patient is planning to use *** for contraception/attempt conception.   Federico Flake, MD, MPH, ABFM, [facility] Attending Physician Center for [facility] for [facility], [facility]

## 2022-02-02 ENCOUNTER — Ambulatory Visit: Payer: Medicaid Other | Admitting: Obstetrics & Gynecology

## 2022-02-21 ENCOUNTER — Other Ambulatory Visit: Payer: Self-pay | Admitting: *Deleted

## 2022-02-21 NOTE — Telephone Encounter (Signed)
Erroneous

## 2022-02-22 ENCOUNTER — Ambulatory Visit: Payer: Medicaid Other | Admitting: Family Medicine

## 2022-02-22 ENCOUNTER — Encounter: Payer: Self-pay | Admitting: Family Medicine

## 2022-02-22 NOTE — Progress Notes (Signed)
Patient did not keep appointment today. She may call to reschedule.  

## 2022-04-17 ENCOUNTER — Ambulatory Visit: Payer: Medicaid Other | Admitting: Family Medicine

## 2022-04-17 ENCOUNTER — Encounter: Payer: Self-pay | Admitting: Family Medicine

## 2022-04-18 NOTE — Progress Notes (Signed)
Patient did not keep appointment today. She may call to reschedule.  

## 2022-05-01 ENCOUNTER — Other Ambulatory Visit: Payer: Self-pay | Admitting: *Deleted

## 2022-05-01 MED ORDER — FLUCONAZOLE 150 MG PO TABS: ORAL_TABLET | ORAL | 1 refills | Status: DC

## 2022-06-01 ENCOUNTER — Encounter: Payer: Self-pay | Admitting: Advanced Practice Midwife

## 2022-06-01 ENCOUNTER — Ambulatory Visit (INDEPENDENT_AMBULATORY_CARE_PROVIDER_SITE_OTHER): Payer: Medicaid Other | Admitting: Advanced Practice Midwife

## 2022-06-01 VITALS — BP 123/76 | HR 120

## 2022-06-01 DIAGNOSIS — Z3046 Encounter for surveillance of implantable subdermal contraceptive: Secondary | ICD-10-CM | POA: Diagnosis not present

## 2022-06-01 NOTE — Progress Notes (Signed)
     GYNECOLOGY OFFICE PROCEDURE NOTE  Susan [name] is a 21 y.o. G1P0010 here for Nexplanon removal.  Last pap smear was on 12/13/2021 and + for LSIL with plan for repeat pap 11/2022.  No other gynecologic concerns.  Nexplanon Removal Patient identified, informed consent performed, consent signed.   Appropriate time out taken. Nexplanon site identified.  Area prepped in usual sterile fashon. One ml of 1% lidocaine was used to anesthetize the area at the distal end of the implant. A small stab incision was made right beside the implant on the distal portion.  The Nexplanon rod was grasped using hemostats and removed without difficulty.  There was minimal blood loss. There were no complications.  3 ml of 1% lidocaine was injected around the incision for post-procedure analgesia.  Steri-strips were applied over the small incision.  A pressure bandage was applied to reduce any bruising.  The patient tolerated the procedure well and was given post procedure instructions.  Patient desires pregnancy and declines alternate contraceptive method.  RTC for pregnancy confirmation or to initiate prenatal care  Pap due 11/2022  Clayton Bibles, MSA, MSN, CNM Certified Nurse Midwife, [facility] for [employer], [facility]

## 2022-06-12 ENCOUNTER — Encounter: Payer: Self-pay | Admitting: Advanced Practice Midwife

## 2022-06-13 ENCOUNTER — Other Ambulatory Visit: Payer: Self-pay | Admitting: *Deleted

## 2022-06-13 MED ORDER — FLUCONAZOLE 150 MG PO TABS: ORAL_TABLET | ORAL | 1 refills | Status: DC

## 2022-08-15 ENCOUNTER — Encounter: Payer: Self-pay | Admitting: Obstetrics & Gynecology

## 2022-08-15 ENCOUNTER — Ambulatory Visit (INDEPENDENT_AMBULATORY_CARE_PROVIDER_SITE_OTHER): Payer: Self-pay

## 2022-08-15 ENCOUNTER — Ambulatory Visit (INDEPENDENT_AMBULATORY_CARE_PROVIDER_SITE_OTHER): Payer: Self-pay | Admitting: Obstetrics & Gynecology

## 2022-08-15 VITALS — BP 122/73 | HR 97 | Temp 98.5°F | Wt 135.0 lb

## 2022-08-15 DIAGNOSIS — Z3A01 Less than 8 weeks gestation of pregnancy: Secondary | ICD-10-CM

## 2022-08-15 DIAGNOSIS — R109 Unspecified abdominal pain: Secondary | ICD-10-CM

## 2022-08-15 DIAGNOSIS — N912 Amenorrhea, unspecified: Secondary | ICD-10-CM

## 2022-08-15 DIAGNOSIS — O26891 Other specified pregnancy related conditions, first trimester: Secondary | ICD-10-CM

## 2022-08-15 DIAGNOSIS — O219 Vomiting of pregnancy, unspecified: Secondary | ICD-10-CM

## 2022-08-15 LAB — POCT URINE PREGNANCY: Preg Test, Ur: POSITIVE — AB

## 2022-08-15 LAB — POCT URINALYSIS DIPSTICK
Bilirubin, UA: NEGATIVE
Blood, UA: NEGATIVE
Glucose, UA: NEGATIVE
Ketones, UA: NEGATIVE
Leukocytes, UA: NEGATIVE
Nitrite, UA: NEGATIVE
Protein, UA: NEGATIVE
Spec Grav, UA: >=1.03 — AB (ref 1.010–1.025)
Urobilinogen, UA: 0.2 E.U./dL
pH, UA: 6 (ref 5.0–8.0)

## 2022-08-15 MED ORDER — PRENATAL 28-0.8 MG PO TABS: 1.0000 | ORAL_TABLET | Freq: Every day | ORAL | 12 refills | Status: AC

## 2022-08-15 MED ORDER — BONJESTA 20-20 MG PO TBCR: 1.0000 | EXTENDED_RELEASE_TABLET | Freq: Every evening | ORAL | 1 refills | Status: DC | PRN

## 2022-08-15 NOTE — Progress Notes (Signed)
GYNECOLOGY OFFICE VISIT NOTE  History:   Susan [name] is a 21 y.o. G2P0010 at [redacted]w[redacted]d by LMP here today for report of positive UPT. Had some lower abdominal pain and nausea yesterday, she is worried given history of SAB.  Less pain today, no nausea. Accompanied by her mother.  She denies any abnormal vaginal discharge, bleeding, severe pelvic pain or other concerns.    History reviewed. No pertinent past medical history.  History reviewed. No pertinent surgical history.  The following portions of the patient's history were reviewed and updated as appropriate: allergies, current medications, past family history, past medical history, past social history, past surgical history and problem list.   Health Maintenance:  LGSIL pap and negative HRHPV on 12/13/2021.   Review of Systems:  Pertinent items noted in HPI and remainder of comprehensive ROS otherwise negative.  Physical Exam:  BP 122/73   Pulse 97   Temp 98.5 F (36.9 C)   Wt 135 lb (61.2 kg)   LMP 07/01/2022 (Exact Date)   BMI 20.53 kg/m  CONSTITUTIONAL: Well-developed, well-nourished female in no acute distress.  HEENT:  Normocephalic, atraumatic. External right and left ear normal. No scleral icterus.  NECK: Normal range of motion, supple, no masses noted on observation SKIN: No rash noted. Not diaphoretic. No erythema. No pallor. MUSCULOSKELETAL: Normal range of motion. No edema noted. NEUROLOGIC: Alert and oriented to person, place, and time. Normal muscle tone coordination. No cranial nerve deficit noted. PSYCHIATRIC: Normal mood and affect. Normal behavior. Normal judgment and thought content. CARDIOVASCULAR: Normal heart rate noted RESPIRATORY: Effort and breath sounds normal, no problems with respiration noted ABDOMEN: Mild suprapubic tenderness to palpation.  No rebound or guarding. No masses noted. No other overt distention noted.   PELVIC: Deferred  Labs and Imaging Results for orders placed or performed in  visit on 08/15/22 (from the past 168 hour(s))  POCT Urinalysis Dipstick   Collection Time: 08/15/22  8:54 AM  Result Value Ref Range   Color, UA Dark    Clarity, UA clear    Glucose, UA Negative Negative   Bilirubin, UA Neg    Ketones, UA Neg    Spec Grav, UA >=1.030 (A) 1.010 - 1.025   Blood, UA Neg    pH, UA 6.0 5.0 - 8.0   Protein, UA Negative Negative   Urobilinogen, UA 0.2 0.2 or 1.0 E.U./dL   Nitrite, UA Neg    Leukocytes, UA Negative Negative   Appearance     Odor    POCT urine pregnancy   Collection Time: 08/15/22  8:55 AM  Result Value Ref Range   Preg Test, Ur Positive (A) Negative   US OB Limited  Result Date: 08/15/2022 ----------------------------------------------------------------------  OBSTETRICS REPORT                       (Signed Final 08/15/2022 12:05 pm) ---------------------------------------------------------------------- Patient Info  ID #:       [id number]                          D.O.B.:  [birth year] (21 yrs)  Name:       Susan [name]                 Visit Date: 08/15/2022 10:09 am ---------------------------------------------------------------------- Performed By  Attending:        Jaynie Collins         Ref. Address:     [address]                    [place]                                                             [address]  Performed By:     Scheryl Marten RN     Location:         Center for                                                             [facility]  Referred By:      [facility] ---------------------------------------------------------------------- Orders  #  Description                           Code        Ordered By  1  US OB LIMITED                         82956.2     Jaynie Collins ----------------------------------------------------------------------  #  Order #                     Accession #                 Episode #  1  [id number]                   [id number]                 [id number] ---------------------------------------------------------------------- Indications  Less than [redacted] weeks gestation of pregnancy       Z3A.01 ---------------------------------------------------------------------- Fetal Evaluation  Num Of Fetuses:         1  Fetal Heart Rate(bpm):  129  Cardiac Activity:       Observed ---------------------------------------------------------------------- Biometry  CRL:       5.9  mm     G. Age:  6w 3d                   EDD:   04/07/23 ---------------------------------------------------------------------- OB History  Gravidity:    2         Term:   0        Prem:   0  SAB:   1  TOP:          0       Ectopic:  0        Living: 0 ---------------------------------------------------------------------- Gestational Age  LMP:           6w 3d         Date:  07/01/22                  EDD:   04/07/23  Best:          6w 3d      Det. By:  LMP  (07/01/22)          EDD:   04/07/23 ---------------------------------------------------------------------- Comments  CRL consistent with LMP provided ---------------------------------------------------------------------- Impression  Single live intrauterine pregnancy, suggest EDD as above by  CRL consistent with LMP. ---------------------------------------------------------------------- Recommendations  Continue prenatal care as recommended. ----------------------------------------------------------------------               Jaynie Collins, MD Electronically Signed Final Report   08/15/2022 12:05 pm ----------------------------------------------------------------------     Assessment and Plan:     1. Nausea and vomiting during pregnancy 2. Abdominal pain in pregnancy, first trimester 3. Amenorrhea 4. [redacted] weeks gestation of pregnancy Office ultrasound showed viable  single IUP, patient reassured.   Bonjesta samples and Vitafol samples given, also prescribed Bonjesta  and generic prenatal vitamins for patient.  - Doxylamine-Pyridoxine ER (BONJESTA) 20-20 MG TBCR; Take 1 capsule by mouth at bedtime as needed (nausea).  Dispense: 60 tablet; Refill: 1 - Prenatal 28-0.8 MG TABS; Take 1 tablet by mouth daily.  Dispense: 30 tablet; Refill: 12 Pain and bleeding precautions reviewed with patient.  Given list of safe medications to take in pregnancy, advised to avoid alcohol and other potential toxic substances.   Routine preventative health maintenance measures emphasized, repeat pap due March 2024. Please refer to After Visit Summary for other counseling recommendations.   Return in about 4 weeks (around 09/12/2022) for NOB visit.    I spent 25 minutes dedicated to the care of this patient including pre-visit review of records, face to face time with the patient discussing her conditions and treatments and post visit orders.    Jaynie Collins, MD, FACOG Obstetrician & Gynecologist, [facility] for [employer], [facility]

## 2022-10-03 ENCOUNTER — Encounter: Payer: Medicaid Other | Admitting: Obstetrics and Gynecology

## 2022-10-19 ENCOUNTER — Encounter: Payer: Self-pay | Admitting: Advanced Practice Midwife

## 2022-10-19 ENCOUNTER — Ambulatory Visit (INDEPENDENT_AMBULATORY_CARE_PROVIDER_SITE_OTHER): Payer: Self-pay | Admitting: Advanced Practice Midwife

## 2022-10-19 ENCOUNTER — Other Ambulatory Visit (HOSPITAL_COMMUNITY)
Admission: RE | Admit: 2022-10-19 | Discharge: 2022-10-19 | Disposition: A | Discharge status: Home / Self Care | Payer: Medicaid Other | Source: Ambulatory Visit | Attending: Obstetrics and Gynecology | Admitting: Obstetrics and Gynecology

## 2022-10-19 VITALS — BP 122/81 | HR 114 | Wt 150.0 lb

## 2022-10-19 DIAGNOSIS — Z3490 Encounter for supervision of normal pregnancy, unspecified, unspecified trimester: Secondary | ICD-10-CM | POA: Insufficient documentation

## 2022-10-19 DIAGNOSIS — Z3482 Encounter for supervision of other normal pregnancy, second trimester: Secondary | ICD-10-CM

## 2022-10-19 DIAGNOSIS — Z3A15 15 weeks gestation of pregnancy: Secondary | ICD-10-CM

## 2022-10-19 MED ORDER — ASPIRIN 81 MG PO TBEC: 81.0000 mg | DELAYED_RELEASE_TABLET | Freq: Every day | ORAL | 12 refills | Status: DC

## 2022-10-19 MED ORDER — CETIRIZINE HCL 10 MG PO TABS: 10.0000 mg | ORAL_TABLET | Freq: Every day | ORAL | 0 refills | Status: DC

## 2022-10-19 NOTE — Patient Instructions (Signed)

## 2022-10-19 NOTE — Progress Notes (Signed)
NOB [redacted]w[redacted]d Family Present for support  Pt does NOT want FOB to know pregnancy Hx etc. FOB has MS  Last pap: 12/13/21 Genetic Screening:Desires pt does not want to know gender   AFP Today.   CC: Cramping, Diarrhea.  Pt wants to discuss safe foods and medications.

## 2022-10-20 NOTE — Progress Notes (Signed)
History:   Susan [name] is a 22 y.o. G2P0010 at [redacted]w[redacted]d by LMP being seen today for her first obstetrical visit.  Her obstetrical history does not contain significant risk factors. Patient does intend to breast feed and is considering supplementing with formula if needed. Pregnancy history fully reviewed.  Patient reports  abdominal cramping and brief episodes of diarrhea .  Patient is accompanied by her mom, her FOB Imontis and patient's younger brother.     HISTORY: OB History  Gravida Para Term Preterm AB Living  2 0 0 0 1 0  SAB IAB Ectopic Multiple Live Births  1 0 0 0 0    # Outcome Date GA Lbr Len/2nd Weight Sex Delivery Anes PTL Lv  2 Current           1 SAB      SAB       Last pap smear was done 12/13/2021 and was  LSIL  History reviewed. No pertinent past medical history. History reviewed. No pertinent surgical history. Family History  Problem Relation Age of Onset   Hyperlipidemia Paternal Grandmother    Hypertension Paternal Grandmother    Cancer Paternal Grandmother    Heart disease Paternal Grandmother    Thyroid disease Maternal Grandmother    Cancer Maternal Grandmother    Heart disease Maternal Grandfather    Epilepsy Maternal Grandfather    High Cholesterol Maternal Grandfather    Stroke Maternal Grandfather    Hypertension Maternal Grandfather    Heart disease Father    Hypertension Father    Cancer Mother 64       ovarian cancer   Asthma Mother    Hypertension Mother    Epilepsy Mother    High Cholesterol Mother    Social History   Tobacco Use   Smoking status: Never   Smokeless tobacco: Never  Vaping Use   Vaping Use: Some days  Substance Use Topics   Alcohol use: No   Drug use: No   No Known Allergies Current Outpatient Medications on File Prior to Visit  Medication Sig Dispense Refill   Doxylamine-Pyridoxine ER (BONJESTA) 20-20 MG TBCR Take 1 capsule by mouth at bedtime as needed (nausea). 60 tablet 1   Prenatal 28-0.8 MG TABS  Take 1 tablet by mouth daily. 30 tablet 12   No current facility-administered medications on file prior to visit.    Review of Systems Pertinent items noted in HPI and remainder of comprehensive ROS otherwise negative. Physical Exam:   Vitals:   10/19/22 1516  BP: 122/81  Pulse: (!) 114  Weight: 150 lb (68 kg)   Fetal Heart Rate (bpm): 147 by Doppler System: General: well-developed, well-nourished female in no acute distress   Skin: normal coloration and turgor, no rashes   Neurologic: oriented, normal, negative, normal mood   Extremities: normal strength, tone, and muscle mass, ROM of all joints is normal   HEENT PERRLA, extraocular movement intact and sclera clear, anicteric   Mouth/Teeth mucous membranes moist, pharynx normal without lesions and dental hygiene good   Neck supple and no masses   Cardiovascular: regular rate   Respiratory:  no respiratory distress, normal RR     Assessment:    Pregnancy: G2P0010 Patient Active Problem List   Diagnosis Date Noted   Supervision of normal pregnancy, antepartum 10/19/2022   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 Encounter for supervision of normal pregnancy, antepartum, unspecified gravidity - Congrats  and welcome to care! Grateful for patient's trust as well as that of her family - Significant discussion regarding food restrictions, physiologic changes, common second trimester complaints - Reviewed minor nose bleeds and sinus congestion can be common in pregnancy, Rx Zyrtec, advised saline nasal spray - CBC/D/Plt+RPR+Rh+ABO+RubIgG... - Culture, OB Urine - Cervicovaginal ancillary only - Panorama Prenatal Test Full Panel - AFP, Serum, Open Spina Bifida - [place] MFM OB COMP + 14 WK; Future - H[facility] CUSTOM  2. [redacted] weeks gestation of pregnancy   Initial labs drawn. Continue prenatal vitamins. Genetic Screening discussed, First trimester screen, Quad screen, and NIPS: ordered. Ultrasound discussed; fetal  anatomic survey: ordered. Problem list reviewed and updated. The nature of [place] with multiple MDs and other Advanced Practice Providers was explained to patient; also emphasized that residents, students are part of our team. Routine obstetric precautions reviewed. Return in about 4 weeks (around 11/16/2022) for Sam please.     Mallie Snooks, MSN, CNM Certified Nurse Midwife, [facility] for [employer] [place] [facility] 10/20/22 8:33 AM

## 2022-10-21 LAB — CBC/D/PLT+RPR+RH+ABO+RUBIGG...
Antibody Screen: NEGATIVE
Basophils Absolute: 0 10*3/uL (ref 0.0–0.2)
Basos: 0 %
EOS (ABSOLUTE): 0.1 10*3/uL (ref 0.0–0.4)
Eos: 1 %
HCV Ab: NONREACTIVE
HIV Screen 4th Generation wRfx: NONREACTIVE
Hematocrit: 36.4 % (ref 34.0–46.6)
Hemoglobin: 12.2 g/dL (ref 11.1–15.9)
Hepatitis B Surface Ag: NEGATIVE
Immature Grans (Abs): 0.1 10*3/uL (ref 0.0–0.1)
Immature Granulocytes: 1 %
Lymphocytes Absolute: 1.7 10*3/uL (ref 0.7–3.1)
Lymphs: 17 %
MCH: 29.7 pg (ref 26.6–33.0)
MCHC: 33.5 g/dL (ref 31.5–35.7)
MCV: 89 fL (ref 79–97)
Monocytes Absolute: 0.5 10*3/uL (ref 0.1–0.9)
Monocytes: 5 %
Neutrophils Absolute: 7.6 10*3/uL — ABNORMAL HIGH (ref 1.4–7.0)
Neutrophils: 76 %
Platelets: 234 10*3/uL (ref 150–450)
RBC: 4.11 x10E6/uL (ref 3.77–5.28)
RDW: 15 % (ref 11.7–15.4)
RPR Ser Ql: NONREACTIVE
Rh Factor: POSITIVE
Rubella Antibodies, IGG: 1.97 index (ref >0.99)
WBC: 9.8 10*3/uL (ref 3.4–10.8)

## 2022-10-21 LAB — AFP, SERUM, OPEN SPINA BIFIDA
AFP MoM: 1.25
AFP Value: 40.6 ng/mL
Gest. Age on Collection Date: 15 weeks
Maternal Age At EDD: 23 yr
OSBR Risk 1 IN: 10000
Test Results:: NEGATIVE
Weight: 150 [lb_av]

## 2022-10-21 LAB — HCV INTERPRETATION

## 2022-10-21 LAB — CULTURE, OB URINE

## 2022-10-21 LAB — URINE CULTURE, OB REFLEX

## 2022-10-23 LAB — CERVICOVAGINAL ANCILLARY ONLY
Chlamydia: NEGATIVE
Comment: NEGATIVE
Comment: NORMAL
Neisseria Gonorrhea: NEGATIVE

## 2022-10-27 ENCOUNTER — Other Ambulatory Visit: Payer: Self-pay | Admitting: Family Medicine

## 2022-10-27 ENCOUNTER — Encounter: Payer: Self-pay | Admitting: Family Medicine

## 2022-10-27 DIAGNOSIS — G43809 Other migraine, not intractable, without status migrainosus: Secondary | ICD-10-CM

## 2022-10-27 LAB — PANORAMA PRENATAL TEST FULL PANEL:PANORAMA TEST PLUS 5 ADDITIONAL MICRODELETIONS: FETAL FRACTION: 4

## 2022-10-27 MED ORDER — CYCLOBENZAPRINE HCL 10 MG PO TABS: 10.0000 mg | ORAL_TABLET | Freq: Three times a day (TID) | ORAL | 1 refills | Status: DC | PRN

## 2022-10-27 MED ORDER — BUTALBITAL-APAP-CAFFEINE 50-325-40 MG PO CAPS: 1.0000 | ORAL_CAPSULE | Freq: Four times a day (QID) | ORAL | 0 refills | Status: DC | PRN

## 2022-10-29 LAB — HORIZON CUSTOM: REPORT SUMMARY: NEGATIVE

## 2022-10-30 ENCOUNTER — Other Ambulatory Visit: Payer: Self-pay | Admitting: Family Medicine

## 2022-10-30 DIAGNOSIS — G43809 Other migraine, not intractable, without status migrainosus: Secondary | ICD-10-CM

## 2022-10-30 MED ORDER — BUTALBITAL-APAP-CAFFEINE 50-325-40 MG PO CAPS: 1.0000 | ORAL_CAPSULE | Freq: Four times a day (QID) | ORAL | 0 refills | Status: DC | PRN

## 2022-11-14 ENCOUNTER — Ambulatory Visit: Payer: Medicaid Other | Attending: Advanced Practice Midwife

## 2022-11-15 ENCOUNTER — Encounter: Payer: Medicaid Other | Admitting: Family Medicine

## 2022-11-16 ENCOUNTER — Encounter: Payer: Medicaid Other | Admitting: Advanced Practice Midwife

## 2022-12-08 ENCOUNTER — Encounter: Payer: Medicaid Other | Admitting: Family Medicine

## 2022-12-11 ENCOUNTER — Encounter: Payer: Medicaid Other | Admitting: Obstetrics & Gynecology

## 2022-12-12 ENCOUNTER — Encounter: Payer: Self-pay | Admitting: Obstetrics & Gynecology

## 2022-12-12 ENCOUNTER — Ambulatory Visit (INDEPENDENT_AMBULATORY_CARE_PROVIDER_SITE_OTHER): Payer: Medicaid Other | Admitting: Obstetrics & Gynecology

## 2022-12-12 ENCOUNTER — Encounter: Payer: Medicaid Other | Admitting: Obstetrics & Gynecology

## 2022-12-12 VITALS — BP 127/82 | HR 111 | Wt 165.0 lb

## 2022-12-12 DIAGNOSIS — Z3482 Encounter for supervision of other normal pregnancy, second trimester: Secondary | ICD-10-CM | POA: Diagnosis not present

## 2022-12-12 DIAGNOSIS — Z3490 Encounter for supervision of normal pregnancy, unspecified, unspecified trimester: Secondary | ICD-10-CM

## 2022-12-12 DIAGNOSIS — Z3A23 23 weeks gestation of pregnancy: Secondary | ICD-10-CM

## 2022-12-12 NOTE — Progress Notes (Signed)
   PRENATAL VISIT NOTE  Subjective:  Susan [name] is a 22 y.o. G2P0010 at [redacted]w[redacted]d being seen today for ongoing prenatal care.  She is currently monitored for the following issues for this low-risk pregnancy and has LGSIL on Pap smear of cervix and Supervision of normal pregnancy, antepartum on their problem list.  Patient reports no complaints.  Contractions: Not present. Vag. Bleeding: None.  Movement: Present. Denies leaking of fluid.   The following portions of the patient's history were reviewed and updated as appropriate: allergies, current medications, past family history, past medical history, past social history, past surgical history and problem list.   Objective:   Vitals:   12/12/22 1526  BP: 127/82  Pulse: (!) 111  Weight: 165 lb (74.8 kg)    Fetal Status: Fetal Heart Rate (bpm): 150 Fundal Height: 22 cm Movement: Present     General:  Alert, oriented and cooperative. Patient is in no acute distress.  Skin: Skin is warm and dry. No rash noted.   Cardiovascular: Normal heart rate noted  Respiratory: Normal respiratory effort, no problems with respiration noted  Abdomen: Soft, gravid, appropriate for gestational age.  Pain/Pressure: Absent     Pelvic: Cervical exam deferred        Extremities: Normal range of motion.  Edema: None  Mental Status: Normal mood and affect. Normal behavior. Normal judgment and thought content.   Assessment and Plan:  Pregnancy: G2P0010 at [redacted]w[redacted]d 1. [redacted] weeks gestation of pregnancy 2. Encounter for supervision of normal pregnancy Missed 19 week anatomy scan, this will be rescheduled. Preterm labor symptoms and general obstetric precautions including but not limited to vaginal bleeding, contractions, leaking of fluid and fetal movement were reviewed in detail with the patient. Please refer to After Visit Summary for other counseling recommendations.   Return in about 4 weeks (around 01/09/2023) for 2 hr GTT, 3rd trimester labs, TDap, OFFICE OB  VISIT (MD or APP).  Future Appointments  Date Time Provider [place]  01/09/2023  8:30 AM [facility]  01/09/2023 10:55 AM Wahid Holley, Sallyanne Havers, MD [facility]  01/19/2023  1:45 PM [facility]  01/30/2023  8:35 AM Maryela Tapper, Sallyanne Havers, MD [facility]  02/13/2023  8:35 AM Constant, Vickii Chafe, MD [facility]    Verita Schneiders, MD

## 2022-12-12 NOTE — Patient Instructions (Signed)
Return to office for any scheduled appointments. Call the office or go to the MAU at [facility] [place] if: You begin to have strong, frequent contractions Your water breaks.  Sometimes it is a big gush of fluid, sometimes it is just a trickle that keeps getting your underwear wet or running down your legs You have vaginal bleeding.  It is normal to have a small amount of spotting if your cervix was checked.  You do not feel your baby moving like normal.  If you do not, get something to eat and drink and lay down and focus on feeling your baby move.   If your baby is still not moving like normal, you should call the office or go to MAU. Any other obstetric concerns.  Oral Glucose Tolerance Test During Pregnancy Why am I having this test? The oral glucose tolerance test (GTT) is done to check how your body processes blood sugar (glucose). This is one of several tests used to diagnose diabetes that develops during pregnancy (gestational diabetes mellitus). Gestational diabetes is a short-term form of diabetes that some women develop while they are pregnant. It usually occurs during the second or third trimester of pregnancy and goes away after delivery. Testing, or screening, for gestational diabetes usually occurs around 28 of pregnancy. This test may also be needed earlier if: You have a history of gestational diabetes. There is a history of giving birth to very large babies or of losing pregnancies (having stillbirths). You have signs and symptoms of diabetes, such as: Changes in your eyesight. Tingling or numbness in your hands or feet. Changes in hunger, thirst, and urination, and these are not explained by your pregnancy. What is being tested? This test measures the amount of glucose in your blood at different times during a period of 2 hours. This shows how well your body can process glucose.  You will have three separate blood draws. What kind of sample is  taken?  Blood samples are required for this test. They are usually collected by inserting a needle into a blood vessel. How do I prepare for this test? For 3 days before your test, eat normally. Have plenty of carbohydrate-rich foods. You will be asked not to eat or drink anything other than water (to fast) starting 8-10 hours before the test. Tell a health care provider about: All medicines you are taking, including vitamins, herbs, eye drops, creams, and over-the-counter medicines. Any blood disorders you have. Any surgeries you have had. Any medical conditions you have. What happens during the test? First, your blood glucose will be measured. This is referred to as your fasting blood glucose because you fasted before the test. Then, you will drink a glucose solution that contains a certain amount of glucose. Your blood glucose will be measured again 1 and 2 hours after you drink the solution. This test takes about 2 hours to complete. You will need to stay at the testing location during this time. During the testing period: Do not eat or drink anything other than the glucose solution. Do not exercise. Do not use any products that contain nicotine or tobacco, such as cigarettes, e-cigarettes, and chewing tobacco. These can affect your test results. If you need help quitting, ask your health care provider. The testing procedure may vary among health care providers and hospitals. How are the results reported? Your results will be reported as milligrams of glucose per deciliter of blood (mg/dL) or millimoles per liter (mmol/L). There   is more than one source for screening and diagnosis reference values used to diagnose gestational diabetes. Your health care provider will compare your results to normal values that were established after testing a large group of people (reference values). Reference values may vary among labs and hospitals. For this test, reference values are: Fasting: 92 mg/dL 1  hour: 180 mg/dL  2 hour: 153 mg/dL   What do the results mean? Results below the reference values are considered normal. If one or more of your blood glucose levels are at or above the reference values, you will be diagnosed with gestational diabetes.  Talk with your health care provider about what your results mean. Questions to ask your health care provider Ask your health care provider, or the department that is doing the test: When will my results be ready? How will I get my results? What are my treatment options? What other tests do I need? What are my next steps? Summary The oral glucose tolerance test (GTT) is one of several tests used to diagnose diabetes that develops during pregnancy (gestational diabetes mellitus). Gestational diabetes is a short-term form of diabetes that some women develop while they are pregnant. You may also have this test if you have any symptoms or risk factors for this type of diabetes. Talk with your health care provider about what your results mean. This information is not intended to replace advice given to you by your health care provider. Make sure you discuss any questions you have with your health care provider.   TDaP Vaccine Pregnancy Get the Whooping Cough Vaccine While You Are Pregnant (CDC)  It is important for women to get the whooping cough vaccine in the third trimester of each pregnancy. Vaccines are the best way to prevent this disease. There are 2 different whooping cough vaccines. Both vaccines combine protection against whooping cough, tetanus and diphtheria, but they are for different age groups: Tdap: for everyone 11 years or older, including pregnant women  DTaP: for children 2 months through 6 years of age  You need the whooping cough vaccine during each of your pregnancies The recommended time to get the shot is during your 27th through 36th week of pregnancy, preferably during the earlier part of this time period. The Centers  for Disease Control and Prevention (CDC) recommends that pregnant women receive the whooping cough vaccine for adolescents and adults (called Tdap vaccine) during the third trimester of each pregnancy. The recommended time to get the shot is during your 27th through 36th week of pregnancy, preferably during the earlier part of this time period. This replaces the original recommendation that pregnant women get the vaccine only if they had not previously received it. The American College of Obstetricians and Gynecologists and the American College of Nurse-Midwives support this recommendation.  You should get the whooping cough vaccine while pregnant to pass protection to your baby frame support disabled and/or not supported in this browser  Learn why Laura decided to get the whooping cough vaccine in her 3rd trimester of pregnancy and how her baby girl was born with some protection against the disease. Also available on YouTube. After receiving the whooping cough vaccine, your body will create protective antibodies (proteins produced by the body to fight off diseases) and pass some of them to your baby before birth. These antibodies provide your baby some short-term protection against whooping cough in early life. These antibodies can also protect your baby from some of the more serious complications that come along   with whooping cough. Your protective antibodies are at their highest about 2 weeks after getting the vaccine, but it takes time to pass them to your baby. So the preferred time to get the whooping cough vaccine is early in your third trimester. The amount of whooping cough antibodies in your body decreases over time. That is why CDC recommends you get a whooping cough vaccine during each pregnancy. Doing so allows each of your babies to get the greatest number of protective antibodies from you. This means each of your babies will get the best protection possible against this disease.  Getting  the whooping cough vaccine while pregnant is better than getting the vaccine after you give birth Whooping cough vaccination during pregnancy is ideal so your baby will have short-term protection as soon as he is born. This early protection is important because your baby will not start getting his whooping cough vaccines until he is 2 months old. These first few months of life are when your baby is at greatest risk for catching whooping cough. This is also when he's at greatest risk for having severe, potentially life-threating complications from the infection. To avoid that gap in protection, it is best to get a whooping cough vaccine during pregnancy. You will then pass protection to your baby before he is born. To continue protecting your baby, he should get whooping cough vaccines starting at 2 months old. You may never have gotten the Tdap vaccine before and did not get it during this pregnancy. If so, you should make sure to get the vaccine immediately after you give birth, before leaving the hospital or birthing center. It will take about 2 weeks before your body develops protection (antibodies) in response to the vaccine. Once you have protection from the vaccine, you are less likely to give whooping cough to your newborn while caring for him. But remember, your baby will still be at risk for catching whooping cough from others. A recent study looked to see how effective Tdap was at preventing whooping cough in babies whose mothers got the vaccine while pregnant or in the hospital after giving birth. The study found that getting Tdap between 27 through 36 weeks of pregnancy is 85% more effective at preventing whooping cough in babies younger than 2 months old. Blood tests cannot tell if you need a whooping cough vaccine There are no blood tests that can tell you if you have enough antibodies in your body to protect yourself or your baby against whooping cough. Even if you have been sick with whooping  cough in the past or previously received the vaccine, you still should get the vaccine during each pregnancy. Breastfeeding may pass some protective antibodies onto your baby By breastfeeding, you may pass some antibodies you have made in response to the vaccine to your baby. When you get a whooping cough vaccine during your pregnancy, you will have antibodies in your breast milk that you can share with your baby as soon as your milk comes in. However, your baby will not get protective antibodies immediately if you wait to get the whooping cough vaccine until after delivering your baby. This is because it takes about 2 weeks for your body to create antibodies. Learn more about the health benefits of breastfeeding.  

## 2023-01-09 ENCOUNTER — Other Ambulatory Visit: Payer: Medicaid Other

## 2023-01-09 ENCOUNTER — Ambulatory Visit (INDEPENDENT_AMBULATORY_CARE_PROVIDER_SITE_OTHER): Payer: Medicaid Other | Admitting: Obstetrics & Gynecology

## 2023-01-09 ENCOUNTER — Encounter: Payer: Self-pay | Admitting: Obstetrics & Gynecology

## 2023-01-09 VITALS — BP 125/80 | HR 97 | Wt 181.0 lb

## 2023-01-09 DIAGNOSIS — Z3A27 27 weeks gestation of pregnancy: Secondary | ICD-10-CM

## 2023-01-09 DIAGNOSIS — Z1339 Encounter for screening examination for other mental health and behavioral disorders: Secondary | ICD-10-CM | POA: Diagnosis not present

## 2023-01-09 DIAGNOSIS — Z3490 Encounter for supervision of normal pregnancy, unspecified, unspecified trimester: Secondary | ICD-10-CM

## 2023-01-09 DIAGNOSIS — Z23 Encounter for immunization: Secondary | ICD-10-CM

## 2023-01-09 DIAGNOSIS — Z3482 Encounter for supervision of other normal pregnancy, second trimester: Secondary | ICD-10-CM

## 2023-01-09 DIAGNOSIS — Z3480 Encounter for supervision of other normal pregnancy, unspecified trimester: Secondary | ICD-10-CM

## 2023-01-09 DIAGNOSIS — R030 Elevated blood-pressure reading, without diagnosis of hypertension: Secondary | ICD-10-CM

## 2023-01-09 MED ORDER — BLOOD PRESSURE MONITOR KIT
1.0000 | PACK | 0 refills | Status: DC
Start: 2023-01-09 — End: 2023-08-08

## 2023-01-09 NOTE — Progress Notes (Signed)
   PRENATAL VISIT NOTE  Subjective:  Susan [name] is a 22 y.o. G2P0010 at [redacted]w[redacted]d being seen today for ongoing prenatal care.  Accompanied by mother and FOB. She is currently monitored for the following issues for this high-risk pregnancy and has LGSIL on Pap smear of cervix and Supervision of normal pregnancy, antepartum on their problem list.  Patient reports some swelling in feet. Patient denies any headaches, visual symptoms, RUQ/epigastric pain or other concerning symptoms.  Contractions: Irritability. Vag. Bleeding: None.  Movement: Present. Denies leaking of fluid.   The following portions of the patient's history were reviewed and updated as appropriate: allergies, current medications, past family history, past medical history, past social history, past surgical history and problem list.   Objective:   Vitals:   01/09/23 0908 01/09/23 0946  BP: 137/83 125/80  Pulse: 87 97  Weight: 181 lb (82.1 kg)     Fetal Status:   Fundal Height: 27 cm Movement: Present     General:  Alert, oriented and cooperative. Patient is in no acute distress.  Skin: Skin is warm and dry. No rash noted.   Cardiovascular: Normal heart rate noted  Respiratory: Normal respiratory effort, no problems with respiration noted  Abdomen: Soft, gravid, appropriate for gestational age.  Pain/Pressure: Present     Pelvic: Cervical exam deferred        Extremities: Normal range of motion.  Edema: Trace on BLE.  Mental Status: Normal mood and affect. Normal behavior. Normal judgment and thought content.   Assessment and Plan:  Pregnancy: G2P0010 at [redacted]w[redacted]d 1. Borderline blood pressure Mother is very concerned as she had severe preeclampsia and so did patient's MGM.  Recheck BP was normal.  Surveillance labs added on to her GTT and other labs being checked today.  She has BP cuff at home, advised to check BPs weekly and enter into Babyscripts app.  Preeclampsia precautions advised. Will continue close monitoring. -  Comprehensive metabolic panel - Protein / creatinine ratio, urine - Enroll Patient in PreNatal Babyscripts - Babyscripts Schedule Optimization - Blood Pressure Monitor KIT; 1 Device by Does not apply route once a week. To be monitored Regularly at home.  Dispense: 1 kit; Refill: 0  2. Need for Tdap vaccination - Tdap vaccine greater than or equal to 7yo IM given today.  3. [redacted] weeks gestation of pregnancy 4. Supervision of other normal pregnancy, antepartum Third trimester labs being done today, will follow up results and manage accordingly. Preterm labor symptoms and general obstetric precautions including but not limited to vaginal bleeding, contractions, leaking of fluid and fetal movement were reviewed in detail with the patient. Please refer to After Visit Summary for other counseling recommendations.   Return in about 3 weeks (around 01/30/2023) for OB visits as scheduled.  Future Appointments  Date Time Provider Department Center  01/09/2023 10:55 AM Mitzi Lilja, Jethro Bastos, MD [facility]  01/22/2023  1:00 PM [facility]  01/30/2023  8:35 AM Geneva Barrero, Jethro Bastos, MD [facility]  02/13/2023  8:35 AM Constant, Gigi Gin, MD [facility]    Jaynie Collins, MD

## 2023-01-09 NOTE — Patient Instructions (Signed)
Return to office for any scheduled appointments. Call the office or go to the MAU at [facility] [place] if: You begin to have strong, frequent contractions Your water breaks.  Sometimes it is a big gush of fluid, sometimes it is just a trickle that keeps getting your underwear wet or running down your legs You have vaginal bleeding.  It is normal to have a small amount of spotting if your cervix was checked.  You do not feel your baby moving like normal.  If you do not, get something to eat and drink and lay down and focus on feeling your baby move.   If your baby is still not moving like normal, you should call the office or go to MAU. Any other obstetric concerns.   TDaP Vaccine Pregnancy Get the Whooping Cough Vaccine While You Are Pregnant (CDC)  It is important for women to get the whooping cough vaccine in the third trimester of each pregnancy. Vaccines are the best way to prevent this disease. There are 2 different whooping cough vaccines. Both vaccines combine protection against whooping cough, tetanus and diphtheria, but they are for different age groups: Tdap: for everyone 11 years or older, including pregnant women  DTaP: for children 2 months through 6 years of age  You need the whooping cough vaccine during each of your pregnancies The recommended time to get the shot is during your 27th through 36th week of pregnancy, preferably during the earlier part of this time period. The Centers for Disease Control and Prevention (CDC) recommends that pregnant women receive the whooping cough vaccine for adolescents and adults (called Tdap vaccine) during the third trimester of each pregnancy. The recommended time to get the shot is during your 27th through 36th week of pregnancy, preferably during the earlier part of this time period. This replaces the original recommendation that pregnant women get the vaccine only if they had not previously received it. The American  College of Obstetricians and Gynecologists and the American College of Nurse-Midwives support this recommendation.  You should get the whooping cough vaccine while pregnant to pass protection to your baby frame support disabled and/or not supported in this browser  Learn why Laura decided to get the whooping cough vaccine in her 3rd trimester of pregnancy and how her baby girl was born with some protection against the disease. Also available on YouTube. After receiving the whooping cough vaccine, your body will create protective antibodies (proteins produced by the body to fight off diseases) and pass some of them to your baby before birth. These antibodies provide your baby some short-term protection against whooping cough in early life. These antibodies can also protect your baby from some of the more serious complications that come along with whooping cough. Your protective antibodies are at their highest about 2 weeks after getting the vaccine, but it takes time to pass them to your baby. So the preferred time to get the whooping cough vaccine is early in your third trimester. The amount of whooping cough antibodies in your body decreases over time. That is why CDC recommends you get a whooping cough vaccine during each pregnancy. Doing so allows each of your babies to get the greatest number of protective antibodies from you. This means each of your babies will get the best protection possible against this disease.  Getting the whooping cough vaccine while pregnant is better than getting the vaccine after you give birth Whooping cough vaccination during pregnancy is ideal so   your baby will have short-term protection as soon as he is born. This early protection is important because your baby will not start getting his whooping cough vaccines until he is 2 months old. These first few months of life are when your baby is at greatest risk for catching whooping cough. This is also when he's at greatest  risk for having severe, potentially life-threating complications from the infection. To avoid that gap in protection, it is best to get a whooping cough vaccine during pregnancy. You will then pass protection to your baby before he is born. To continue protecting your baby, he should get whooping cough vaccines starting at 2 months old. You may never have gotten the Tdap vaccine before and did not get it during this pregnancy. If so, you should make sure to get the vaccine immediately after you give birth, before leaving the hospital or birthing center. It will take about 2 weeks before your body develops protection (antibodies) in response to the vaccine. Once you have protection from the vaccine, you are less likely to give whooping cough to your newborn while caring for him. But remember, your baby will still be at risk for catching whooping cough from others. A recent study looked to see how effective Tdap was at preventing whooping cough in babies whose mothers got the vaccine while pregnant or in the hospital after giving birth. The study found that getting Tdap between 27 through 36 weeks of pregnancy is 85% more effective at preventing whooping cough in babies younger than 2 months old. Blood tests cannot tell if you need a whooping cough vaccine There are no blood tests that can tell you if you have enough antibodies in your body to protect yourself or your baby against whooping cough. Even if you have been sick with whooping cough in the past or previously received the vaccine, you still should get the vaccine during each pregnancy. Breastfeeding may pass some protective antibodies onto your baby By breastfeeding, you may pass some antibodies you have made in response to the vaccine to your baby. When you get a whooping cough vaccine during your pregnancy, you will have antibodies in your breast milk that you can share with your baby as soon as your milk comes in. However, your baby will not get  protective antibodies immediately if you wait to get the whooping cough vaccine until after delivering your baby. This is because it takes about 2 weeks for your body to create antibodies. Learn more about the health benefits of breastfeeding.  

## 2023-01-10 LAB — CBC
Hematocrit: 38.9 % (ref 34.0–46.6)
Hemoglobin: 12.9 g/dL (ref 11.1–15.9)
MCH: 30.8 pg (ref 26.6–33.0)
MCHC: 33.2 g/dL (ref 31.5–35.7)
MCV: 93 fL (ref 79–97)
Platelets: 203 10*3/uL (ref 150–450)
RBC: 4.19 x10E6/uL (ref 3.77–5.28)
RDW: 13.1 % (ref 11.7–15.4)
WBC: 10 10*3/uL (ref 3.4–10.8)

## 2023-01-10 LAB — GLUCOSE TOLERANCE, 2 HOURS W/ 1HR
Glucose, 1 hour: 90 mg/dL (ref 70–179)
Glucose, 2 hour: 78 mg/dL (ref 70–152)
Glucose, Fasting: 79 mg/dL (ref 70–91)

## 2023-01-10 LAB — RPR: RPR Ser Ql: NONREACTIVE

## 2023-01-10 LAB — HIV ANTIBODY (ROUTINE TESTING W REFLEX): HIV Screen 4th Generation wRfx: NONREACTIVE

## 2023-01-11 LAB — COMPREHENSIVE METABOLIC PANEL
ALT: 15 IU/L (ref 0–32)
AST: 20 IU/L (ref 0–40)
Albumin/Globulin Ratio: 1.4 (ref 1.2–2.2)
Albumin: 3.5 g/dL — ABNORMAL LOW (ref 4.0–5.0)
Alkaline Phosphatase: 71 IU/L (ref 44–121)
BUN/Creatinine Ratio: 11 (ref 9–23)
BUN: 8 mg/dL (ref 6–20)
Bilirubin Total: <0.2 mg/dL (ref 0.0–1.2)
CO2: 18 mmol/L — ABNORMAL LOW (ref 20–29)
Calcium: 9 mg/dL (ref 8.7–10.2)
Chloride: 104 mmol/L (ref 96–106)
Creatinine, Ser: 0.74 mg/dL (ref 0.57–1.00)
Globulin, Total: 2.5 g/dL (ref 1.5–4.5)
Glucose: 90 mg/dL (ref 70–99)
Potassium: 4.3 mmol/L (ref 3.5–5.2)
Sodium: 139 mmol/L (ref 134–144)
Total Protein: 6 g/dL (ref 6.0–8.5)
eGFR: 117 mL/min/{1.73_m2} (ref >59)

## 2023-01-11 LAB — PROTEIN / CREATININE RATIO, URINE
Creatinine, Urine: 172.4 mg/dL
Protein, Ur: 53.3 mg/dL
Protein/Creat Ratio: 309 mg/g creat — ABNORMAL HIGH (ref 0–200)

## 2023-01-15 ENCOUNTER — Encounter: Payer: Self-pay | Admitting: Obstetrics & Gynecology

## 2023-01-15 DIAGNOSIS — O1213 Gestational proteinuria, third trimester: Secondary | ICD-10-CM | POA: Insufficient documentation

## 2023-01-19 ENCOUNTER — Ambulatory Visit: Payer: Medicaid Other

## 2023-01-22 ENCOUNTER — Ambulatory Visit: Payer: Medicaid Other

## 2023-01-22 ENCOUNTER — Other Ambulatory Visit: Payer: Self-pay | Admitting: Advanced Practice Midwife

## 2023-01-22 ENCOUNTER — Other Ambulatory Visit: Payer: Self-pay

## 2023-01-22 ENCOUNTER — Ambulatory Visit: Payer: Medicaid Other | Attending: Obstetrics

## 2023-01-22 DIAGNOSIS — O0933 Supervision of pregnancy with insufficient antenatal care, third trimester: Secondary | ICD-10-CM | POA: Insufficient documentation

## 2023-01-22 DIAGNOSIS — Z3A15 15 weeks gestation of pregnancy: Secondary | ICD-10-CM

## 2023-01-22 DIAGNOSIS — Z3A29 29 weeks gestation of pregnancy: Secondary | ICD-10-CM

## 2023-01-22 DIAGNOSIS — O365930 Maternal care for other known or suspected poor fetal growth, third trimester, not applicable or unspecified: Secondary | ICD-10-CM | POA: Diagnosis not present

## 2023-01-22 DIAGNOSIS — Z3490 Encounter for supervision of normal pregnancy, unspecified, unspecified trimester: Secondary | ICD-10-CM

## 2023-01-22 DIAGNOSIS — Z363 Encounter for antenatal screening for malformations: Secondary | ICD-10-CM | POA: Insufficient documentation

## 2023-01-22 NOTE — Procedures (Signed)
Susan [name] Feb 05, 2001 [redacted]w[redacted]d  Fetus A Non-Stress Test Interpretation for 01/22/23  Indication:  Borderline HTN, IUGR  Fetal Heart Rate A Mode: External Baseline Rate (A): 145 bpm Variability: Moderate Accelerations: 15 x 15 Decelerations: None Multiple birth?: No  Uterine Activity Mode: Toco  Interpretation (Fetal Testing) Nonstress Test Interpretation: Reactive (Dr. Parke Poisson read remotely)

## 2023-01-23 ENCOUNTER — Encounter: Payer: Self-pay | Admitting: Advanced Practice Midwife

## 2023-01-23 DIAGNOSIS — O43199 Other malformation of placenta, unspecified trimester: Secondary | ICD-10-CM | POA: Insufficient documentation

## 2023-01-23 DIAGNOSIS — Z364 Encounter for antenatal screening for fetal growth retardation: Secondary | ICD-10-CM | POA: Insufficient documentation

## 2023-01-26 ENCOUNTER — Other Ambulatory Visit: Payer: Self-pay | Admitting: Obstetrics

## 2023-01-26 DIAGNOSIS — O365930 Maternal care for other known or suspected poor fetal growth, third trimester, not applicable or unspecified: Secondary | ICD-10-CM

## 2023-01-30 ENCOUNTER — Ambulatory Visit: Payer: Medicaid Other

## 2023-01-30 ENCOUNTER — Encounter (HOSPITAL_COMMUNITY): Payer: Self-pay | Admitting: Obstetrics and Gynecology

## 2023-01-30 ENCOUNTER — Other Ambulatory Visit: Payer: Medicaid Other

## 2023-01-30 ENCOUNTER — Inpatient Hospital Stay (HOSPITAL_COMMUNITY)
Admission: AD | Admit: 2023-01-30 | Discharge: 2023-02-09 | DRG: 787 | Disposition: A | Discharge status: Home / Self Care | Payer: Medicaid Other | Attending: Obstetrics and Gynecology | Admitting: Obstetrics and Gynecology

## 2023-01-30 ENCOUNTER — Ambulatory Visit: Payer: Medicaid Other | Admitting: *Deleted

## 2023-01-30 ENCOUNTER — Encounter: Payer: Medicaid Other | Admitting: Obstetrics & Gynecology

## 2023-01-30 ENCOUNTER — Ambulatory Visit (HOSPITAL_BASED_OUTPATIENT_CLINIC_OR_DEPARTMENT_OTHER): Payer: Medicaid Other

## 2023-01-30 ENCOUNTER — Other Ambulatory Visit: Payer: Self-pay

## 2023-01-30 DIAGNOSIS — Z3A30 30 weeks gestation of pregnancy: Secondary | ICD-10-CM

## 2023-01-30 DIAGNOSIS — Z3490 Encounter for supervision of normal pregnancy, unspecified, unspecified trimester: Principal | ICD-10-CM

## 2023-01-30 DIAGNOSIS — Z3A31 31 weeks gestation of pregnancy: Secondary | ICD-10-CM | POA: Diagnosis not present

## 2023-01-30 DIAGNOSIS — O365930 Maternal care for other known or suspected poor fetal growth, third trimester, not applicable or unspecified: Secondary | ICD-10-CM

## 2023-01-30 DIAGNOSIS — O1213 Gestational proteinuria, third trimester: Secondary | ICD-10-CM

## 2023-01-30 DIAGNOSIS — Z975 Presence of (intrauterine) contraceptive device: Secondary | ICD-10-CM

## 2023-01-30 DIAGNOSIS — O1413 Severe pre-eclampsia, third trimester: Secondary | ICD-10-CM | POA: Diagnosis not present

## 2023-01-30 DIAGNOSIS — O0933 Supervision of pregnancy with insufficient antenatal care, third trimester: Secondary | ICD-10-CM | POA: Diagnosis not present

## 2023-01-30 DIAGNOSIS — O1414 Severe pre-eclampsia complicating childbirth: Secondary | ICD-10-CM | POA: Diagnosis present

## 2023-01-30 DIAGNOSIS — Z30017 Encounter for initial prescription of implantable subdermal contraceptive: Secondary | ICD-10-CM | POA: Diagnosis not present

## 2023-01-30 DIAGNOSIS — O1493 Unspecified pre-eclampsia, third trimester: Secondary | ICD-10-CM | POA: Diagnosis not present

## 2023-01-30 DIAGNOSIS — Z7982 Long term (current) use of aspirin: Secondary | ICD-10-CM

## 2023-01-30 DIAGNOSIS — Z30014 Encounter for initial prescription of intrauterine contraceptive device: Secondary | ICD-10-CM | POA: Diagnosis not present

## 2023-01-30 DIAGNOSIS — O365990 Maternal care for other known or suspected poor fetal growth, unspecified trimester, not applicable or unspecified: Secondary | ICD-10-CM | POA: Diagnosis not present

## 2023-01-30 DIAGNOSIS — O43199 Other malformation of placenta, unspecified trimester: Secondary | ICD-10-CM | POA: Diagnosis present

## 2023-01-30 DIAGNOSIS — O99892 Other specified diseases and conditions complicating childbirth: Secondary | ICD-10-CM | POA: Diagnosis not present

## 2023-01-30 DIAGNOSIS — N179 Acute kidney failure, unspecified: Secondary | ICD-10-CM | POA: Diagnosis not present

## 2023-01-30 DIAGNOSIS — O43123 Velamentous insertion of umbilical cord, third trimester: Secondary | ICD-10-CM | POA: Diagnosis present

## 2023-01-30 DIAGNOSIS — O09813 Supervision of pregnancy resulting from assisted reproductive technology, third trimester: Secondary | ICD-10-CM | POA: Diagnosis not present

## 2023-01-30 DIAGNOSIS — O1410 Severe pre-eclampsia, unspecified trimester: Secondary | ICD-10-CM | POA: Insufficient documentation

## 2023-01-30 DIAGNOSIS — O4423 Partial placenta previa NOS or without hemorrhage, third trimester: Secondary | ICD-10-CM | POA: Diagnosis not present

## 2023-01-30 HISTORY — DX: Severe pre-eclampsia, third trimester: O14.13

## 2023-01-30 HISTORY — DX: Anemia, unspecified: D64.9

## 2023-01-30 LAB — COMPREHENSIVE METABOLIC PANEL
ALT: 14 U/L (ref 0–44)
AST: 21 U/L (ref 15–41)
Albumin: 2 g/dL — ABNORMAL LOW (ref 3.5–5.0)
Alkaline Phosphatase: 64 U/L (ref 38–126)
Anion gap: 11 (ref 5–15)
BUN: 10 mg/dL (ref 6–20)
CO2: 19 mmol/L — ABNORMAL LOW (ref 22–32)
Calcium: 8.1 mg/dL — ABNORMAL LOW (ref 8.9–10.3)
Chloride: 104 mmol/L (ref 98–111)
Creatinine, Ser: 0.84 mg/dL (ref 0.44–1.00)
GFR, Estimated: >60 mL/min (ref >60)
Glucose, Bld: 104 mg/dL — ABNORMAL HIGH (ref 70–99)
Potassium: 3.7 mmol/L (ref 3.5–5.1)
Sodium: 134 mmol/L — ABNORMAL LOW (ref 135–145)
Total Bilirubin: 0.2 mg/dL — ABNORMAL LOW (ref 0.3–1.2)
Total Protein: 5.1 g/dL — ABNORMAL LOW (ref 6.5–8.1)

## 2023-01-30 LAB — CBC
HCT: 36.1 % (ref 36.0–46.0)
HCT: 41.7 % (ref 36.0–46.0)
Hemoglobin: 12.3 g/dL (ref 12.0–15.0)
Hemoglobin: 14.2 g/dL (ref 12.0–15.0)
MCH: 30.8 pg (ref 26.0–34.0)
MCH: 30.9 pg (ref 26.0–34.0)
MCHC: 34.1 g/dL (ref 30.0–36.0)
MCHC: 34.1 g/dL (ref 30.0–36.0)
MCV: 90.5 fL (ref 80.0–100.0)
MCV: 90.8 fL (ref 80.0–100.0)
Platelets: 209 10*3/uL (ref 150–400)
Platelets: 218 10*3/uL (ref 150–400)
RBC: 3.99 MIL/uL (ref 3.87–5.11)
RBC: 4.59 MIL/uL (ref 3.87–5.11)
RDW: 14 % (ref 11.5–15.5)
RDW: 13.9 % (ref 11.5–15.5)
WBC: 11.3 10*3/uL — ABNORMAL HIGH (ref 4.0–10.5)
WBC: 11.3 10*3/uL — ABNORMAL HIGH (ref 4.0–10.5)
nRBC: 0 % (ref 0.0–0.2)
nRBC: 0 % (ref 0.0–0.2)

## 2023-01-30 LAB — PROTEIN / CREATININE RATIO, URINE
Creatinine, Urine: 232 mg/dL
Protein Creatinine Ratio: 9.65 mg/mg{Cre} — ABNORMAL HIGH (ref 0.00–0.15)
Total Protein, Urine: 2239 mg/dL

## 2023-01-30 LAB — TYPE AND SCREEN
ABO/RH(D): O POS
Antibody Screen: NEGATIVE

## 2023-01-30 MED ORDER — MAGNESIUM SULFATE 40 GM/1000ML IV SOLN
2.0000 g/h | INTRAVENOUS | Status: DC
  Administered 2023-01-30 – 2023-01-31 (×3): 2 g/h via INTRAVENOUS
  Filled 2023-01-30 (×2): qty 1000

## 2023-01-30 MED ORDER — MAGNESIUM SULFATE BOLUS VIA INFUSION
4.0000 g | Freq: Once | INTRAVENOUS | Status: AC
  Administered 2023-01-30: 4 g via INTRAVENOUS
  Filled 2023-01-30: qty 1000

## 2023-01-30 MED ORDER — ENOXAPARIN SODIUM 40 MG/0.4ML IJ SOSY
40.0000 mg | PREFILLED_SYRINGE | INTRAMUSCULAR | Status: DC
  Administered 2023-01-31 – 2023-02-05 (×6): 40 mg via SUBCUTANEOUS
  Filled 2023-01-30 (×6): qty 0.4

## 2023-01-30 MED ORDER — ACETAMINOPHEN 325 MG PO TABS
650.0000 mg | ORAL_TABLET | ORAL | Status: DC | PRN
  Administered 2023-01-31 – 2023-02-03 (×4): 650 mg via ORAL
  Filled 2023-01-30 (×4): qty 2

## 2023-01-30 MED ORDER — ZOLPIDEM TARTRATE 5 MG PO TABS: 5.0000 mg | ORAL_TABLET | Freq: Every evening | ORAL | Status: DC | PRN

## 2023-01-30 MED ORDER — LACTATED RINGERS IV SOLN: INTRAVENOUS | Status: DC

## 2023-01-30 MED ORDER — BETAMETHASONE SOD PHOS & ACET 6 (3-3) MG/ML IJ SUSP
12.0000 mg | INTRAMUSCULAR | Status: AC
  Administered 2023-01-30 – 2023-01-31 (×2): 12 mg via INTRAMUSCULAR
  Filled 2023-01-30: qty 5

## 2023-01-30 MED ORDER — LACTATED RINGERS IV SOLN: 125.0000 mL/h | INTRAVENOUS | Status: AC

## 2023-01-30 MED ORDER — PRENATAL MULTIVITAMIN CH
1.0000 | ORAL_TABLET | Freq: Every day | ORAL | Status: DC
  Administered 2023-01-31 – 2023-02-05 (×6): 1 via ORAL
  Filled 2023-01-30 (×6): qty 1

## 2023-01-30 MED ORDER — CALCIUM CARBONATE ANTACID 500 MG PO CHEW: 2.0000 | CHEWABLE_TABLET | ORAL | Status: DC | PRN

## 2023-01-30 MED ORDER — DOCUSATE SODIUM 100 MG PO CAPS
100.0000 mg | ORAL_CAPSULE | Freq: Every day | ORAL | Status: DC
  Administered 2023-01-31 – 2023-02-09 (×9): 100 mg via ORAL
  Filled 2023-01-30 (×9): qty 1

## 2023-01-30 NOTE — MAU Note (Signed)
Susan [name] is a 22 y.o. at [redacted]w[redacted]d here in MAU reporting: sent from [facility] office for BP evaluation. Denies HA and epigastric pain, does endorses intermittent blurred vision & seeing spots.  Reports FM, denies LOF and VB. LMP: NA Onset of complaint: today Pain score: 0 Vitals:   01/30/23 1527  BP: (!) 149/89  Pulse: 93  Resp: 20  Temp: 98.3 F (36.8 C)  SpO2: 97%     FHT:136 bpm Lab orders placed from triage:   UA

## 2023-01-30 NOTE — H&P (Signed)
ANTEPARTUM ADMISSION HISTORY AND PHYSICAL NOTE  Susan [name] is a 22 y.o. female G2P0010 with IUP at [redacted]w[redacted]d by being admitted for Preeclampsia with Severe Features. Patient reports intermittent scotomata for the past ten days. She reports positive fetal movement. She denies leakage of fluid, vaginal bleeding, or contractions.   She plans on breast feeding. Her contraception plan is: Nexplanon.  Prenatal History/Complications: PNC at [facility]:  @[redacted]w[redacted]d , CWD, normal anatomy, cephalic presentation, posterior fundal placenta, 1%ile, EFW 1007g  Pregnancy complications:  - IUGR (1%) with periods of absent flow - Severe Preeclampsia - LGSIL on pap - Marginal cord insertion  Past Medical History: Past Medical History:  Diagnosis Date   Anemia     Past Surgical History: Past Surgical History:  Procedure Laterality Date   NO PAST SURGERIES      Obstetrical History: OB History     Gravida  2   Para  0   Term  0   Preterm  0   AB  1   Living  0      SAB  1   IAB  0   Ectopic  0   Multiple  0   Live Births  0           Social History: Social History   Socioeconomic History   Marital status: Single    Spouse name: Imontios   Number of children: Not on file   Years of education: Not on file   Highest education level: Not on file  Occupational History   Occupation: [employer]s  Tobacco Use   Smoking status: Never   Smokeless tobacco: Never  Vaping Use   Vaping Use: Never used  Substance and Sexual Activity   Alcohol use: No   Drug use: No   Sexual activity: Yes  Other Topics Concern   Not on file  Social History Narrative   Not on file   Social Determinants of Health   Financial Resource Strain: Not on file  Food Insecurity: Not on file  Transportation Needs: Not on file  Physical Activity: Not on file  Stress: Not on file  Social Connections: Not on file    Family History: Family History  Problem Relation Age of Onset    Hyperlipidemia Paternal Grandmother    Hypertension Paternal Grandmother    Cancer Paternal Grandmother    Heart disease Paternal Grandmother    Thyroid disease Maternal Grandmother    Cancer Maternal Grandmother    Heart disease Maternal Grandfather    Epilepsy Maternal Grandfather    High Cholesterol Maternal Grandfather    Stroke Maternal Grandfather    Hypertension Maternal Grandfather    Heart disease Father    Hypertension Father    Cancer Mother 54       ovarian cancer   Asthma Mother    Hypertension Mother    Epilepsy Mother    High Cholesterol Mother     Allergies: No Known Allergies  Medications Prior to Admission  Medication Sig Dispense Refill Last Dose   aspirin EC 81 MG tablet Take 1 tablet (81 mg total) by mouth daily. Swallow whole. 30 tablet 12 01/30/2023   Prenatal 28-0.8 MG TABS Take 1 tablet by mouth daily. 30 tablet 12 01/30/2023   Blood Pressure Monitor KIT 1 Device by Does not apply route once a week. To be monitored Regularly at home. 1 kit 0    Butalbital-APAP-Caffeine 50-325-40 MG capsule Take 1-2 capsules by mouth every 6 (six) hours  as needed for headache. (Patient not taking: Reported on 01/09/2023) 15 capsule 0    cyclobenzaprine (FLEXERIL) 10 MG tablet Take 1 tablet (10 mg total) by mouth every 8 (eight) hours as needed for muscle spasms. (Patient not taking: Reported on 01/09/2023) 30 tablet 1    Doxylamine-Pyridoxine ER (BONJESTA) 20-20 MG TBCR Take 1 capsule by mouth at bedtime as needed (nausea). (Patient not taking: Reported on 01/09/2023) 60 tablet 1      Review of Systems  All systems reviewed and negative except as stated in HPI  Physical Exam Blood pressure (!) 152/92, pulse 72, temperature 98.3 F (36.8 C), temperature source Oral, resp. rate 20, height 5\' 8"  (1.727 m), weight 89.5 kg, last menstrual period 07/01/2022, SpO2 97 %. General appearance: alert, oriented Lungs: normal respiratory effort Heart: regular rate Abdomen: soft,  non-tender; gravid Extremities: No calf swelling or tenderness  Fetal monitoring: Baseline 140, mod var, + accels, no decels Uterine activity: quiet     Prenatal labs: ABO, Rh: O/Positive/-- (01/25 1603) Antibody: Negative (01/25 1603) Rubella: 1.97 (01/25 1603) RPR: Non Reactive (04/16 0911)  HBsAg: Negative (01/25 1603)  HIV: Non Reactive (04/16 0911)  GC/Chlamydia: Neg  GBS:   Unknown 2-hr GTT: WNL Genetic screening:  Low risk female Anatomy US: FGR 1%, marginal cord  Prenatal Transfer Tool  Maternal Diabetes: No Genetic Screening: Normal Maternal Ultrasounds/Referrals: IUGR Fetal Ultrasounds or other Referrals:  Referred to Materal Fetal Medicine  Maternal Substance Abuse:  No Significant Maternal Medications:  None Significant Maternal Lab Results: Other: P:Cr 9.65 on 01/30/2023  Results for orders placed or performed during the hospital encounter of 01/30/23 (from the past 24 hour(s))  Protein / creatinine ratio, urine   Collection Time: 01/30/23  3:32 PM  Result Value Ref Range   Creatinine, Urine 232 mg/dL   Total Protein, Urine 2,239 mg/dL   Protein Creatinine Ratio 9.65 (H) 0.00 - 0.15 mg/mg[Cre]  Comprehensive metabolic panel   Collection Time: 01/30/23  3:57 PM  Result Value Ref Range   Sodium 134 (L) 135 - 145 mmol/L   Potassium 3.7 3.5 - 5.1 mmol/L   Chloride 104 98 - 111 mmol/L   CO2 19 (L) 22 - 32 mmol/L   Glucose, Bld 104 (H) 70 - 99 mg/dL   BUN 10 6 - 20 mg/dL   Creatinine, Ser 1.61 0.44 - 1.00 mg/dL   Calcium 8.1 (L) 8.9 - 10.3 mg/dL   Total Protein 5.1 (L) 6.5 - 8.1 g/dL   Albumin 2.0 (L) 3.5 - 5.0 g/dL   AST 21 15 - 41 U/L   ALT 14 0 - 44 U/L   Alkaline Phosphatase 64 38 - 126 U/L   Total Bilirubin 0.2 (L) 0.3 - 1.2 mg/dL   GFR, Estimated >09 >60 mL/min   Anion gap 11 5 - 15  CBC   Collection Time: 01/30/23  3:57 PM  Result Value Ref Range   WBC 11.3 (H) 4.0 - 10.5 K/uL   RBC 3.99 3.87 - 5.11 MIL/uL   Hemoglobin 12.3 12.0 - 15.0 g/dL    HCT 45.4 09.8 - 11.9 %   MCV 90.5 80.0 - 100.0 fL   MCH 30.8 26.0 - 34.0 pg   MCHC 34.1 30.0 - 36.0 g/dL   RDW 14.7 82.9 - 56.2 %   Platelets 209 150 - 400 K/uL   nRBC 0.0 0.0 - 0.2 %    Patient Active Problem List   Diagnosis Date Noted   IUGR (intrauterine growth restriction)  affecting care of mother 01/30/2023   Encounter for antenatal screening for fetal growth restriction 01/23/2023   Marginal insertion of umbilical cord affecting management of mother 01/23/2023   Proteinuria affecting pregnancy in third trimester 01/15/2023   Supervision of normal pregnancy, antepartum 10/19/2022   LGSIL on Pap smear of cervix 12/16/2021    Assessment: -VERDEAN BRANDO is a 22 y.o. G2P0010 at [redacted]w[redacted]d admitted for Severe Preeclampsia. -Pregnancy also complicated by IUGR 1% -Ten-day history of scotomata, not present during MAU encounter 01/30/2023 - Per Dr. Alvester Morin, admit to [facility]  Calvert Cantor, MSA, MSN, CNM 01/30/2023, 6:30 PM

## 2023-01-31 ENCOUNTER — Encounter: Payer: Medicaid Other | Admitting: Family Medicine

## 2023-01-31 DIAGNOSIS — O1413 Severe pre-eclampsia, third trimester: Secondary | ICD-10-CM | POA: Diagnosis not present

## 2023-01-31 DIAGNOSIS — Z3A30 30 weeks gestation of pregnancy: Secondary | ICD-10-CM | POA: Diagnosis not present

## 2023-01-31 DIAGNOSIS — O365930 Maternal care for other known or suspected poor fetal growth, third trimester, not applicable or unspecified: Secondary | ICD-10-CM

## 2023-01-31 DIAGNOSIS — O365990 Maternal care for other known or suspected poor fetal growth, unspecified trimester, not applicable or unspecified: Secondary | ICD-10-CM | POA: Diagnosis not present

## 2023-01-31 DIAGNOSIS — O1493 Unspecified pre-eclampsia, third trimester: Secondary | ICD-10-CM

## 2023-01-31 LAB — COMPREHENSIVE METABOLIC PANEL
ALT: 16 U/L (ref 0–44)
AST: 25 U/L (ref 15–41)
Albumin: 2.2 g/dL — ABNORMAL LOW (ref 3.5–5.0)
Alkaline Phosphatase: 79 U/L (ref 38–126)
Anion gap: 8 (ref 5–15)
BUN: 9 mg/dL (ref 6–20)
CO2: 18 mmol/L — ABNORMAL LOW (ref 22–32)
Calcium: 8.1 mg/dL — ABNORMAL LOW (ref 8.9–10.3)
Chloride: 106 mmol/L (ref 98–111)
Creatinine, Ser: 0.84 mg/dL (ref 0.44–1.00)
GFR, Estimated: >60 mL/min (ref >60)
Glucose, Bld: 131 mg/dL — ABNORMAL HIGH (ref 70–99)
Potassium: 4.1 mmol/L (ref 3.5–5.1)
Sodium: 132 mmol/L — ABNORMAL LOW (ref 135–145)
Total Bilirubin: 0.3 mg/dL (ref 0.3–1.2)
Total Protein: 5.8 g/dL — ABNORMAL LOW (ref 6.5–8.1)

## 2023-01-31 LAB — MAGNESIUM: Magnesium: 5.8 mg/dL — ABNORMAL HIGH (ref 1.7–2.4)

## 2023-01-31 NOTE — Consult Note (Signed)
   Patient information  Patient Name: Susan [name]  Patient MRN:   [id number]  Referring practice: MFM Referring Provider: [facility])  MFM CONSULT  Susan [name] Vita is a 22 y.o. G2P0010 at [redacted]w[redacted]d admitted for preeclampsia with severe FGR (<1%) with elevated UA dopplers and periods of absent end diastolic flow.  During her prenatal visit her blood pressures were noted to be elevated at 161/102 with a repeat of 152/95.  She reports a 10-day history of scotomata.  I advise she go to labor and delivery for assessment and possible admission.  Upon arrival she was persistently hypertensive at 155/94.  Her preeclampsia labs were normal.  Notably she does have a history of an elevated creatinine 2 years ago at 1.03.  It appears her baseline is somewhere between 0.57-0.94.  She denies headache but continues to have scotomata.  She denies right upper quadrant pain.  I counseled the patient regarding the clinical implications of severe onset early preeclampsia in the setting of fetal growth restriction with periods of absent end-diastolic flow.  Given there is a high risk of stillbirth I recommend inpatient monitoring.  She should have 2-3 times daily NST.  We also discussed monitoring for worsening signs and symptoms of preeclampsia.  While she is admitted she can have UA Dopplers 1-2 times per week.  Fetal growth can be repeated in 2 to 3 weeks.  Assessment -Severe early onset fetal growth restriction -Preeclampsia with severe features -Marginal cord insertion Plan -Continue inpatient care -2-3 times daily NST.   -Monitoring for worsening signs and symptoms of preeclampsia -UA Dopplers 1-2 times per week.   -Fetal growth can be repeated in 2 to 3 weeks. -CMP and CBC twice a week while admitted or sooner if there is concern for worsening preeclampsia -Delivery will be pending clinical course but likely around 32-33 weeks given the periods of absent end diastolic flow.    Review of Systems: A review of systems was performed and was negative except per HPI   Vitals and Physical Exam    01/31/2023    7:59 AM 01/31/2023    4:00 AM 01/31/2023    3:01 AM  Vitals with BMI  Systolic 132 140 829  Diastolic 78 82 85  Pulse 89 91 78  Sitting comfortably on the sonogram table Nonlabored breathing Normal rate and rhythm Abdomen is nontender  Past pregnancies OB History  Gravida Para Term Preterm AB Living  2 0 0 0 1 0  SAB IAB Ectopic Multiple Live Births  1 0 0 0 0    # Outcome Date GA Lbr Len/2nd Weight Sex Delivery Anes PTL Lv  2 Current           1 SAB      SAB        I spent 45 minutes reviewing the patients chart, including labs and images as well as counseling the patient about her medical conditions. Greater than 50% of the time was spent in direct face-to-face patient counseling.  Braxton Feathers  MFM, [facility]   01/31/2023  9:26 AM

## 2023-01-31 NOTE — Consult Note (Signed)
MFM Note  Susan [name] is a 22 year old gravida 2 para 0 currently at 30 weeks and 4 days.  She was admitted due to severe preeclampsia with IUGR with intermittent absent end-diastolic flow noted on her umbilical artery Doppler studies.  Her PIH labs on admission were all within normal limits.  Her P/C ratio was 9.65, indicating significant proteinuria confirming the diagnosis of preeclampsia.    Her blood pressures are currently in the 140s to 150s over 90s range.    Due to preeclampsia, she was started on magnesium sulfate for maternal seizure prophylaxis and fetal neuroprotection.  She was also given a complete course of antenatal corticosteroids.  She had a growth ultrasound last week which showed an EFW of 2 pounds 4 ounces (1st percentile).    Her umbilical artery Doppler studies performed yesterday showed intermittent absent end-diastolic flow.  There were no signs of reversed end-diastolic flow.  The implications and management of preeclampsia was discussed with the patient.    She was advised that preeclampsia can affect both the mother and the fetus.     In the mother, preeclampsia may cause a rise in blood pressures and it can affect the mother's kidney, liver, and platelet functions.  It may also cause the mother to have seizures.     In the fetus, it may cause growth restriction and oligohydramnios.     She understands that delivery is the only treatment for preeclampsia.   Due to severe preeclampsia, I would recommend inpatient management until delivery.     Antihypertensive medication may be started should her blood pressures remain elevated in an attempt to control her blood pressures so that she can reach a more optimal gestational age for delivery.     While hospitalized, she should continue daily fetal testing.    She should have umbilical artery Doppler studies performed twice a week.  Her PIH labs should be obtained twice a week.   The goal for her delivery  would be at around 34 weeks.   Delivery prior to 34 weeks is recommended should she complain of any signs or symptoms of severe preeclampsia, should her blood pressures remain persistently greater than 160/100 despite treatment with medication, should she require IV push antihypertensive medications for blood pressure control, should she show any abnormalities in her preeclampsia labs, should her umbilical artery Doppler studies show reversed end-diastolic flow, or should there be nonreassuring fetal status.   The patient understands that expectant management of severe preeclampsia is usually successful in delaying delivery for 5 to 7 days.  There is a high likelihood that she will require delivery before 34 weeks.  The patient understands that her baby will require a NICU admission after delivery.   Should she require delivery at less than 32 weeks, she should receive magnesium sulfate for fetal neuroprotection.    She should also receive magnesium sulfate for maternal seizure prophylaxis for 24 hours after delivery.    A rescue course of antenatal corticosteroids should be given should she require delivery prior to 34 weeks and it has been one week or more since she received the initial course.    At the end of the consultation, the patient and stated that all of their questions have been answered.   Recommendations:   Inpatient management until delivery Antihypertensive medications as needed Daily fetal testing  Twice weekly preeclampsia labs while hospitalized Twice weekly umbilical artery Doppler studies Rescue course of corticosteroids as indicated Delivery at 34 weeks Delivery prior to 34  weeks would be indicated:             Should her blood pressures be persistently greater than 160/100             Should she require IV push medications for blood pressure control             Should she complain of any signs and symptoms of severe preeclampsia             Should her preeclampsia  labs show any abnormalities  Should her umbilical artery Doppler study showed reversed end-diastolic flow             At any time for nonreassuring fetal status Magnesium sulfate should be given for maternal seizure prophylaxis at the time of delivery

## 2023-01-31 NOTE — Progress Notes (Signed)
Patient ID: Susan [name], female   DOB: June 28, 2001, 22 y.o.   MRN: [id number] FACULTY PRACTICE ANTEPARTUM(COMPREHENSIVE) NOTE  Susan [name] is a 22 y.o. G2P0010 at [redacted]w[redacted]d  who is admitted for severe preeclampsia and FGR with abnormal dopplers.    Fetal presentation is unsure. Length of Stay:  1  Days  Date of admission:01/30/2023  Subjective: Patient reports feeling well this morning and denies any HA, visual changes, RUQ/epigastric pain, nausea or emesis Patient reports the fetal movement as active. Patient reports uterine contraction  activity as none. Patient reports  vaginal bleeding as none. Patient describes fluid per vagina as None.  Vitals:  Blood pressure 132/78, pulse 89, temperature 97.6 F (36.4 C), temperature source Oral, resp. rate 16, height 5\' 8"  (1.727 m), weight 89.5 kg, last menstrual period 07/01/2022, SpO2 98 %. Vitals:   01/31/23 0500 01/31/23 0600 01/31/23 0655 01/31/23 0759  BP:    132/78  Pulse:    89  Resp: 12 12 12 16   Temp:    97.6 F (36.4 C)  TempSrc:    Oral  SpO2:    98%  Weight:      Height:       Physical Examination: GENERAL: Well-developed, well-nourished female in no acute distress.  LUNGS: Clear to auscultation bilaterally.  HEART: Regular rate and rhythm. ABDOMEN: Soft, nontender, gravid PELVIC: Not indicated EXTREMITIES: No cyanosis, clubbing, or edema, 2+ distal pulses.   Fetal Monitoring:    baseline 125, mod variability, +accels, no decels   Labs:  Results for orders placed or performed during the hospital encounter of 01/30/23 (from the past 24 hour(s))  Protein / creatinine ratio, urine   Collection Time: 01/30/23  3:32 PM  Result Value Ref Range   Creatinine, Urine 232 mg/dL   Total Protein, Urine 2,239 mg/dL   Protein Creatinine Ratio 9.65 (H) 0.00 - 0.15 mg/mg[Cre]  Comprehensive metabolic panel   Collection Time: 01/30/23  3:57 PM  Result Value Ref Range   Sodium 134 (L) 135 - 145 mmol/L   Potassium 3.7 3.5 - 5.1  mmol/L   Chloride 104 98 - 111 mmol/L   CO2 19 (L) 22 - 32 mmol/L   Glucose, Bld 104 (H) 70 - 99 mg/dL   BUN 10 6 - 20 mg/dL   Creatinine, Ser 3.24 0.44 - 1.00 mg/dL   Calcium 8.1 (L) 8.9 - 10.3 mg/dL   Total Protein 5.1 (L) 6.5 - 8.1 g/dL   Albumin 2.0 (L) 3.5 - 5.0 g/dL   AST 21 15 - 41 U/L   ALT 14 0 - 44 U/L   Alkaline Phosphatase 64 38 - 126 U/L   Total Bilirubin 0.2 (L) 0.3 - 1.2 mg/dL   GFR, Estimated >40 >10 mL/min   Anion gap 11 5 - 15  CBC   Collection Time: 01/30/23  3:57 PM  Result Value Ref Range   WBC 11.3 (H) 4.0 - 10.5 K/uL   RBC 3.99 3.87 - 5.11 MIL/uL   Hemoglobin 12.3 12.0 - 15.0 g/dL   HCT 27.2 53.6 - 64.4 %   MCV 90.5 80.0 - 100.0 fL   MCH 30.8 26.0 - 34.0 pg   MCHC 34.1 30.0 - 36.0 g/dL   RDW 03.4 74.2 - 59.5 %   Platelets 209 150 - 400 K/uL   nRBC 0.0 0.0 - 0.2 %  Type and screen [facility]   Collection Time: 01/30/23  6:37 PM  Result Value Ref Range   ABO/RH(D)  O POS    Antibody Screen NEG    Sample Expiration      02/02/2023,2359 Performed at [facility], [address] [place], [place] [address]   CBC   Collection Time: 01/30/23  6:39 PM  Result Value Ref Range   WBC 11.3 (H) 4.0 - 10.5 K/uL   RBC 4.59 3.87 - 5.11 MIL/uL   Hemoglobin 14.2 12.0 - 15.0 g/dL   HCT 60.4 54.0 - 98.1 %   MCV 90.8 80.0 - 100.0 fL   MCH 30.9 26.0 - 34.0 pg   MCHC 34.1 30.0 - 36.0 g/dL   RDW 19.1 47.8 - 29.5 %   Platelets 218 150 - 400 K/uL   nRBC 0.0 0.0 - 0.2 %  Comprehensive metabolic panel   Collection Time: 01/30/23 11:47 PM  Result Value Ref Range   Sodium 132 (L) 135 - 145 mmol/L   Potassium 4.1 3.5 - 5.1 mmol/L   Chloride 106 98 - 111 mmol/L   CO2 18 (L) 22 - 32 mmol/L   Glucose, Bld 131 (H) 70 - 99 mg/dL   BUN 9 6 - 20 mg/dL   Creatinine, Ser 6.21 0.44 - 1.00 mg/dL   Calcium 8.1 (L) 8.9 - 10.3 mg/dL   Total Protein 5.8 (L) 6.5 - 8.1 g/dL   Albumin 2.2 (L) 3.5 - 5.0 g/dL   AST 25 15 - 41 U/L   ALT 16 0 - 44 U/L    Alkaline Phosphatase 79 38 - 126 U/L   Total Bilirubin 0.3 0.3 - 1.2 mg/dL   GFR, Estimated >30 >86 mL/min   Anion gap 8 5 - 15  Magnesium   Collection Time: 01/31/23  5:29 AM  Result Value Ref Range   Magnesium 5.8 (H) 1.7 - 2.4 mg/dL    Imaging Studies:    [place] MFM UA CORD DOPPLER  Result Date: 01/30/2023 ----------------------------------------------------------------------  OBSTETRICS REPORT                       (Signed Final 01/30/2023 02:46 pm) ---------------------------------------------------------------------- Patient Info  ID #:       [id number]                          D.O.B.:  05/22/2001 (22 yrs)  Name:       Susan [name]                 Visit Date: 01/30/2023 01:52 pm ---------------------------------------------------------------------- Performed By  Attending:        Braxton Feathers DO       Referred By:      Calvert Cantor  Performed By:     Eden Lathe BS      [facility]                    RDMS RVT  Fetal Care at                                                             [facility]                                                             [facility] ---------------------------------------------------------------------- Orders  #  Description                           Code        Ordered By  1  [place] MFM UA CORD DOPPLER                76820.02    Susan [name]  2  [place] MFM OB LIMITED                     [id number]    Susan [name] ----------------------------------------------------------------------  #  Order #                     Accession #                Episode #  1  [id number]                   [id number]                 [id number]  2  [id number]                   [id number]                 [id number] ---------------------------------------------------------------------- Indications  Maternal care for known or suspected poor      O36.5930  fetal growth, third trimester, not applicable  or  unspecified IUGR  Pregnancy resulting from assisted              O56.819  reproductive technology (IVF)  Insufficient Prenatal Care                     O09.30  [redacted] weeks gestation of pregnancy                Z3A.30  LR NIPS, Neg Horizon ---------------------------------------------------------------------- Fetal Evaluation  Num Of Fetuses:         1  Fetal Heart Rate(bpm):  140  Cardiac Activity:       Observed  Presentation:           Cephalic  Placenta:               Posterior Fundal  P. Cord Insertion:      Marginal insertion prev vis  Amniotic Fluid  AFI FV:      Within normal limits  AFI Sum(cm)     %Tile       Largest Pocket(cm)  15.16           53          4.23  RUQ(cm)       RLQ(cm)  LUQ(cm)        LLQ(cm)  4.17          4.23          3.52           3.24 ---------------------------------------------------------------------- OB History  Gravidity:    2         Term:   0        Prem:   0        SAB:   1  TOP:          0       Ectopic:  0        Living: 0 ---------------------------------------------------------------------- Gestational Age  LMP:           30w 3d        Date:  07/01/22                  EDD:   04/07/23  Best:          Susan [name] 3d     Det. By:  LMP  (07/01/22)          EDD:   04/07/23 ---------------------------------------------------------------------- Anatomy  Stomach:               Appears normal, left   Bladder:                Appears normal                         sided  Kidneys:               Appear normal ---------------------------------------------------------------------- Doppler - Fetal Vessels  Umbilical Artery   S/D     %tile      RI    %tile      PI    %tile     PSV    ADFV    RDFV                                                     (cm/s)   9.22   > 97.5    0.89   > 97.5    1.73   > 97.5    33.08     Yes      No ---------------------------------------------------------------------- Comments  The patient is here for a follow-up ultrasound for FGR (EFW 1  %, AC 2 %, UD dopplers:  elevated with peroids of absent) at  30w 3d. EDD: 04/07/2023. Dating: LMP  (07/01/22). She has  no concerns today.  Sonographic findings  Single intrauterine pregnancy.  Fetal cardiac activity:  Observed and appears normal.  Presentation: Cephalic.  Interval fetal anatomy appears normal.  Amniotic fluid volume: Within normal limits. AFI: 15.16 cm.  MVP: 4.23 cm.  Placenta: Posterior Fundal.  Umbilical artery dopplers findings:  -S/D:9.22 which are elevated with peroids of absent flow at  this gestational age.  -Absent end-diastolic flow: Yes.  -Reversed end-diastolic flow:  No.  Recommendations  - Sent to MAU for preeclampsia r/o and NST. If her BP, labs,  maternal/fetal status are reassuring we will see her as an  outpatient. However, if there is concern for the need to  hospitalizize please reach out to MFM and we would be glad  to further modify her plan of care.  -  If she is discharged we will plan for twice weekly outpatient  testing ----------------------------------------------------------------------                 Susan Feathers, DO Electronically Signed Final Report   01/30/2023 02:46 pm ----------------------------------------------------------------------  [place] MFM OB LIMITED  Result Date: 01/30/2023 ----------------------------------------------------------------------  OBSTETRICS REPORT                       (Signed Final 01/30/2023 02:46 pm) ---------------------------------------------------------------------- Patient Info  ID #:       [id number]                          D.O.B.:  12/11/2000 (21 yrs)  Name:       Susan [name]                 Visit Date: 01/30/2023 01:52 pm ---------------------------------------------------------------------- Performed By  Attending:        Braxton Feathers DO       Referred By:      Calvert Cantor  Performed By:     Eden Lathe BS      [facility]         [facility]                    RDMS RVT                                  [facility] at                                                             [facility]                                                             [facility] ---------------------------------------------------------------------- Orders  #  Description                           Code        Ordered By  1  [place] MFM UA CORD DOPPLER                76820.02    Susan [name]  2  [place] MFM OB LIMITED                     40102.72    Susan [name] ----------------------------------------------------------------------  #  Order #                     Accession #  Episode #  1  [id number]                   [id number]                 [id number]  2  [id number]                   [id number]                 [id number] ---------------------------------------------------------------------- Indications  Maternal care for known or suspected poor      O36.5930  fetal growth, third trimester, not applicable or  unspecified IUGR  Pregnancy resulting from assisted              O92.819  reproductive technology (IVF)  Insufficient Prenatal Care                     O09.30  [redacted] weeks gestation of pregnancy                Z3A.30  LR NIPS, Neg Horizon ---------------------------------------------------------------------- Fetal Evaluation  Num Of Fetuses:         1  Fetal Heart Rate(bpm):  140  Cardiac Activity:       Observed  Presentation:           Cephalic  Placenta:               Posterior Fundal  P. Cord Insertion:      Marginal insertion prev vis  Amniotic Fluid  AFI FV:      Within normal limits  AFI Sum(cm)     %Tile       Largest Pocket(cm)  15.16           53          4.23  RUQ(cm)       RLQ(cm)       LUQ(cm)        LLQ(cm)  4.17          4.23          3.52           3.24 ---------------------------------------------------------------------- OB History  Gravidity:    2         Term:   0        Prem:   0        SAB:   1  TOP:          0       Ectopic:  0        Living: 0  ---------------------------------------------------------------------- Gestational Age  LMP:           30w 3d        Date:  07/01/22                  EDD:   04/07/23  Best:          Susan [name] 3d     Det. By:  LMP  (07/01/22)          EDD:   04/07/23 ---------------------------------------------------------------------- Anatomy  Stomach:               Appears normal, left   Bladder:                Appears normal                         sided  Kidneys:  Appear normal ---------------------------------------------------------------------- Doppler - Fetal Vessels  Umbilical Artery   S/D     %tile      RI    %tile      PI    %tile     PSV    ADFV    RDFV                                                     (cm/s)   9.22   > 97.5    0.89   > 97.5    1.73   > 97.5    33.08     Yes      No ---------------------------------------------------------------------- Comments  The patient is here for a follow-up ultrasound for FGR (EFW 1  %, AC 2 %, UD dopplers: elevated with peroids of absent) at  30w 3d. EDD: 04/07/2023. Dating: LMP  (07/01/22). She has  no concerns today.  Sonographic findings  Single intrauterine pregnancy.  Fetal cardiac activity:  Observed and appears normal.  Presentation: Cephalic.  Interval fetal anatomy appears normal.  Amniotic fluid volume: Within normal limits. AFI: 15.16 cm.  MVP: 4.23 cm.  Placenta: Posterior Fundal.  Umbilical artery dopplers findings:  -S/D:9.22 which are elevated with peroids of absent flow at  this gestational age.  -Absent end-diastolic flow: Yes.  -Reversed end-diastolic flow:  No.  Recommendations  - Sent to MAU for preeclampsia r/o and NST. If her BP, labs,  maternal/fetal status are reassuring we will see her as an  outpatient. However, if there is concern for the need to  hospitalizize please reach out to MFM and we would be glad  to further modify her plan of care.  - If she is discharged we will plan for twice weekly outpatient  testing  ----------------------------------------------------------------------                 Susan Feathers, DO Electronically Signed Final Report   01/30/2023 02:46 pm ----------------------------------------------------------------------    Medications:  Scheduled  betamethasone acetate-betamethasone sodium phosphate  12 mg Intramuscular Q24H   docusate sodium  100 mg Oral Daily   enoxaparin (LOVENOX) injection  40 mg Subcutaneous Q24H   prenatal multivitamin  1 tablet Oral Q1200   I have reviewed the patient's current medications.  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

## 2023-02-01 DIAGNOSIS — Z3A30 30 weeks gestation of pregnancy: Secondary | ICD-10-CM | POA: Diagnosis not present

## 2023-02-01 DIAGNOSIS — O1413 Severe pre-eclampsia, third trimester: Secondary | ICD-10-CM

## 2023-02-01 DIAGNOSIS — O365930 Maternal care for other known or suspected poor fetal growth, third trimester, not applicable or unspecified: Secondary | ICD-10-CM

## 2023-02-01 NOTE — Progress Notes (Signed)
Patient information  Patient Name: Susan [name]  Patient MRN:   [id number]  Referring practice: Dr. Alysia Penna  MFM CONSULT  Susan [name] is a 22 y.o. G2P0010 at [redacted]w[redacted]d here for ultrasound and consultation.   No major events overnight.  The patient is doing well without headaches, vision changes or right upper quadrant pain.  Initially she had a headache after she was started on magnesium but since it has been discontinued she has felt better.  She has met with the NICU with all of her questions answered.  She understands that she is in a critical situation with a growth restricted baby in the setting of preeclampsia and needs inpatient monitoring.  We specifically discussed the high rate of stillbirth and the need to monitor fetal movement.  We also discussed the importance of prolonging her pregnancy as long as everything remains reassuring to avoid complications of prematurity however, I discussed that delivery may need to occur at any time if there is deterioration in the maternal or fetal status.  Assessment -Early onset preeclampsia with severe features -Early onset severe fetal growth restriction (<1%) with intermittently absent Doppler flow Plan -Continue inpatient care as outlined by OB provider - s/p BMZ and mag.  -If there is concern for delivery she should have magnesium until 32 weeks for fetal neuroprotection -If there is concern for delivery and she is greater than 7 days after her betamethasone course she should have a repeat course. -Preeclampsia labs to be done 2-3 times week or sooner with exacerbation of blood pressure -Fetal heart tones to be done 2-3 times a day or sooner if indicated -Ultrasound 2x per week assess fluid and UA dopplers (next should be 02/02/23) -Growth ultrasounds can be done every 2-3 weeks  -Status post NICU consult -Antihypertension medications as needed -Indications for delivery would include deterioration of maternal or fetal status that is not  responsive to medical measures.  This would include but not be limited to nonreassuring fetal status despite intrauterine resuscitation, abnormal maternal labs consistent with worsening preeclampsia, difficult severe range blood pressures despite antihypertensive medication, persistent neurologic features. -Delivery will be around 34 weeks but may need to be moved up depending upon the maternal or fetal status.  If Dopplers are persistently absent then we will move the delivery up to 32 to 34 weeks. -MFM will continue to follow as needed  Review of Systems: A review of systems was performed and was negative except per HPI   Vitals and Physical Exam    02/01/2023   11:24 AM 02/01/2023    8:58 AM 02/01/2023    4:15 AM  Vitals with BMI  Systolic 144 142 295  Diastolic 88 89 59  Pulse 77 90 85   Sitting comfortably on the hospital bed Nonlabored breathing Normal rate and rhythm Abdomen is nontender  Past pregnancies OB History  Gravida Para Term Preterm AB Living  2 0 0 0 1 0  SAB IAB Ectopic Multiple Live Births  1 0 0 0 0    # Outcome Date GA Lbr Len/2nd Weight Sex Delivery Anes PTL Lv  2 Current           1 SAB      SAB      I spent 45 minutes reviewing the patients chart, including labs and images as well as counseling the patient about her medical conditions. Greater than 50% of the time was spent in direct face-to-face patient counseling.  Braxton Feathers  MFM, [facility]   02/01/2023  4:36 PM

## 2023-02-01 NOTE — Progress Notes (Signed)
CSW acknowledged consult that patient is requesting assistance with Medicaid. CSW notified assigned financial counselor who will follow up.   Celso Sickle, LCSW Clinical Social Worker [facility] Cell#: [phone]

## 2023-02-01 NOTE — Progress Notes (Signed)
FACULTY PRACTICE ANTEPARTUM PROGRESS NOTE  Susan [name] is a 22 y.o. G2P0010 at [redacted]w[redacted]d who is admitted for severe preeclampsia and fetal growth restriction with abnormal dopplers.  Estimated Date of Delivery: 04/07/23 Fetal presentation is cephalic.  Length of Stay:  2 Days. Admitted 01/30/2023  Subjective: Pt resting quietly.  She denies headache, visual changes currently and RUQ pain. Patient reports normal fetal movement.  She denies uterine contractions, denies bleeding and leaking of fluid per vagina.  Vitals:  Blood pressure (!) 129/59, pulse 85, temperature 98.2 F (36.8 C), temperature source Oral, resp. rate 16, height 5\' 8"  (1.727 m), weight 89.5 kg, last menstrual period 07/01/2022, SpO2 96 %. Physical Examination: CONSTITUTIONAL: Well-developed, well-nourished female in no acute distress.  HENT:  Normocephalic, atraumatic, External right and left ear normal. Oropharynx is clear and moist EYES: Conjunctivae and EOM are normal.  NECK: Normal range of motion, supple, no masses. SKIN: Skin is warm and dry. No rash noted. Not diaphoretic. No erythema. No pallor. NEUROLGIC: Alert and oriented to person, place, and time. Normal reflexes, muscle tone coordination. No cranial nerve deficit noted. PSYCHIATRIC: Normal mood and affect. Normal behavior. Normal judgment and thought content. CARDIOVASCULAR: Normal heart rate noted, regular rhythm RESPIRATORY: Effort and breath sounds normal, no problems with respiration noted MUSCULOSKELETAL: Normal range of motion. No edema and no tenderness. ABDOMEN: Soft, nontender, nondistended, gravid. CERVIX: deferred  Fetal monitoring: FHR: 130 bpm, Variability: moderate, Accelerations: Present, Decelerations: Absent  Uterine activity: none  Results for orders placed or performed during the hospital encounter of 01/30/23 (from the past 48 hour(s))  Protein / creatinine ratio, urine     Status: Abnormal   Collection Time: 01/30/23  3:32 PM  Result  Value Ref Range   Creatinine, Urine 232 mg/dL   Total Protein, Urine 2,239 mg/dL    Comment: RESULT CONFIRMED BY MANUAL DILUTION NO NORMAL RANGE ESTABLISHED FOR THIS TEST    Protein Creatinine Ratio 9.65 (H) 0.00 - 0.15 mg/mg[Cre]    Comment: Performed at [facility], [address], [place], [place] [address]  Comprehensive metabolic panel     Status: Abnormal   Collection Time: 01/30/23  3:57 PM  Result Value Ref Range   Sodium 134 (L) 135 - 145 mmol/L   Potassium 3.7 3.5 - 5.1 mmol/L   Chloride 104 98 - 111 mmol/L   CO2 19 (L) 22 - 32 mmol/L   Glucose, Bld 104 (H) 70 - 99 mg/dL    Comment: Glucose reference range applies only to samples taken after fasting for at least 8 hours.   BUN 10 6 - 20 mg/dL   Creatinine, Ser 6.04 0.44 - 1.00 mg/dL   Calcium 8.1 (L) 8.9 - 10.3 mg/dL   Total Protein 5.1 (L) 6.5 - 8.1 g/dL   Albumin 2.0 (L) 3.5 - 5.0 g/dL   AST 21 15 - 41 U/L   ALT 14 0 - 44 U/L   Alkaline Phosphatase 64 38 - 126 U/L   Total Bilirubin 0.2 (L) 0.3 - 1.2 mg/dL   GFR, Estimated >54 >09 mL/min    Comment: (NOTE) Calculated using the CKD-EPI Creatinine Equation (2021)    Anion gap 11 5 - 15    Comment: Performed at [facility], [address] [place], [place] [address]  CBC     Status: Abnormal   Collection Time: 01/30/23  3:57 PM  Result Value Ref Range   WBC 11.3 (H) 4.0 - 10.5 K/uL   RBC  3.99 3.87 - 5.11 MIL/uL   Hemoglobin 12.3 12.0 - 15.0 g/dL   HCT 81.1 91.4 - 78.2 %   MCV 90.5 80.0 - 100.0 fL   MCH 30.8 26.0 - 34.0 pg   MCHC 34.1 30.0 - 36.0 g/dL   RDW 95.6 21.3 - 08.6 %   Platelets 209 150 - 400 K/uL   nRBC 0.0 0.0 - 0.2 %    Comment: Performed at [facility], [address], [place], [place] [address]  Type and screen [facility]     Status: None   Collection Time: 01/30/23  6:37 PM  Result Value Ref Range   ABO/RH(D) O POS    Antibody Screen NEG    Sample Expiration      02/02/2023,2359 Performed at [facility], [address] [place], [place] [address]   CBC     Status: Abnormal   Collection Time: 01/30/23  6:39 PM  Result Value Ref Range   WBC 11.3 (H) 4.0 - 10.5 K/uL   RBC 4.59 3.87 - 5.11 MIL/uL   Hemoglobin 14.2 12.0 - 15.0 g/dL   HCT 28.4 13.2 - 44.0 %   MCV 90.8 80.0 - 100.0 fL   MCH 30.9 26.0 - 34.0 pg   MCHC 34.1 30.0 - 36.0 g/dL   RDW 10.2 72.5 - 36.6 %   Platelets 218 150 - 400 K/uL   nRBC 0.0 0.0 - 0.2 %    Comment: Performed at [facility], [address], [place], [place] [address]  Comprehensive metabolic panel     Status: Abnormal   Collection Time: 01/30/23 11:47 PM  Result Value Ref Range   Sodium 132 (L) 135 - 145 mmol/L   Potassium 4.1 3.5 - 5.1 mmol/L   Chloride 106 98 - 111 mmol/L   CO2 18 (L) 22 - 32 mmol/L   Glucose, Bld 131 (H) 70 - 99 mg/dL    Comment: Glucose reference range applies only to samples taken after fasting for at least 8 hours.   BUN 9 6 - 20 mg/dL   Creatinine, Ser 7.42 0.44 - 1.00 mg/dL   Calcium 8.1 (L) 8.9 - 10.3 mg/dL   Total Protein 5.8 (L) 6.5 - 8.1 g/dL   Albumin 2.2 (L) 3.5 - 5.0 g/dL   AST 25 15 - 41 U/L   ALT 16 0 - 44 U/L   Alkaline Phosphatase 79 38 - 126 U/L   Total Bilirubin 0.3 0.3 - 1.2 mg/dL   GFR, Estimated >59 >56 mL/min    Comment: (NOTE) Calculated using the CKD-EPI Creatinine Equation (2021)    Anion gap 8 5 - 15    Comment: Performed at [facility], [address], [place], [place] [address]  Magnesium     Status: Abnormal   Collection Time: 01/31/23  5:29 AM  Result Value Ref Range   Magnesium 5.8 (H) 1.7 - 2.4 mg/dL    Comment: Performed at [facility], [address] [place], [place] [address]    I have reviewed the patient's current medications.  ASSESSMENT: Principal Problem:   Preeclampsia, severe Active Problems:   IUGR (intrauterine growth restriction) affecting care of mother   Severe preeclampsia   PLAN: Pt currently stable, visual changes appear to have  resolved. Pt s/p BMZ x2 as well a magnesium sulfate for seizure prophylaxis and neuroprotection. Labs q 72 hours, next draw 02/02/23? [place] with dopplers 02/02/23 Delivery at 34  weeks unless there are maternal/fetal indications.   Continue routine antenatal care.   Mariel Aloe, MD [facility] Faculty Attending, [facility] 02/01/2023 7:47 AM

## 2023-02-01 NOTE — Consult Note (Signed)
Asked by Dr.Ervin to provide prenatal consultation for this  22 y.o.  G2P0010 mother who is now [redacted]w[redacted]d with her pregnancy complicated by pre-eclampsia and fetal IUGR.  She has been treated with betamethasone and MgSO4 and is being monitored, with plans to prolong pregnancy until 34 wks unless there are further maternal or fetal complications.  Discussed with patient and MGM usual expectations for her female infant at 24 - [redacted] weeks gestation, including possible needs for DR resuscitation, respiratory support, and IV access.  Explained family-centered care approach including parent participation in rounds, decision-making, and also presented usual criteria for discharge. Projected possible length of hospital stay until [facility], although I informed them the baby could be transferred to [facility] once she is stable (she is [facility] residing in [facility]  Discussed advantages of feeding with mother's milk and possible use of donor milk as "bridge" if needed until her supply is sufficient.  Patient and MGM were attentive, had appropriate questions, and expressed appreciation for my input. MGM had pregnancy complicated by pre-eclampsia and delivered twins, one of whom expired shortly after birth (25 years ago)  Thank you for consulting Neonatology.  Total time 45 minutes, face-to-face time 30 minutes  JWimmer, MD

## 2023-02-02 ENCOUNTER — Inpatient Hospital Stay (HOSPITAL_BASED_OUTPATIENT_CLINIC_OR_DEPARTMENT_OTHER): Payer: Medicaid Other

## 2023-02-02 DIAGNOSIS — O365930 Maternal care for other known or suspected poor fetal growth, third trimester, not applicable or unspecified: Secondary | ICD-10-CM

## 2023-02-02 DIAGNOSIS — Z3A30 30 weeks gestation of pregnancy: Secondary | ICD-10-CM

## 2023-02-02 DIAGNOSIS — O0933 Supervision of pregnancy with insufficient antenatal care, third trimester: Secondary | ICD-10-CM | POA: Diagnosis not present

## 2023-02-02 DIAGNOSIS — O09813 Supervision of pregnancy resulting from assisted reproductive technology, third trimester: Secondary | ICD-10-CM | POA: Diagnosis not present

## 2023-02-02 DIAGNOSIS — O1413 Severe pre-eclampsia, third trimester: Secondary | ICD-10-CM | POA: Diagnosis not present

## 2023-02-02 LAB — CBC
HCT: 39.7 % (ref 36.0–46.0)
Hemoglobin: 13 g/dL (ref 12.0–15.0)
MCH: 30.6 pg (ref 26.0–34.0)
MCHC: 32.7 g/dL (ref 30.0–36.0)
MCV: 93.4 fL (ref 80.0–100.0)
Platelets: 229 10*3/uL (ref 150–400)
RBC: 4.25 MIL/uL (ref 3.87–5.11)
RDW: 13.8 % (ref 11.5–15.5)
WBC: 14.1 10*3/uL — ABNORMAL HIGH (ref 4.0–10.5)
nRBC: 0.2 % (ref 0.0–0.2)

## 2023-02-02 LAB — COMPREHENSIVE METABOLIC PANEL
ALT: 15 U/L (ref 0–44)
AST: 22 U/L (ref 15–41)
Albumin: 2 g/dL — ABNORMAL LOW (ref 3.5–5.0)
Alkaline Phosphatase: 74 U/L (ref 38–126)
Anion gap: 7 (ref 5–15)
BUN: 14 mg/dL (ref 6–20)
CO2: 23 mmol/L (ref 22–32)
Calcium: 7.7 mg/dL — ABNORMAL LOW (ref 8.9–10.3)
Chloride: 108 mmol/L (ref 98–111)
Creatinine, Ser: 0.86 mg/dL (ref 0.44–1.00)
GFR, Estimated: >60 mL/min (ref >60)
Glucose, Bld: 103 mg/dL — ABNORMAL HIGH (ref 70–99)
Potassium: 4.1 mmol/L (ref 3.5–5.1)
Sodium: 138 mmol/L (ref 135–145)
Total Bilirubin: 0.5 mg/dL (ref 0.3–1.2)
Total Protein: 5.1 g/dL — ABNORMAL LOW (ref 6.5–8.1)

## 2023-02-02 MED ORDER — NIFEDIPINE ER OSMOTIC RELEASE 30 MG PO TB24
30.0000 mg | ORAL_TABLET | Freq: Every day | ORAL | Status: DC
  Administered 2023-02-02: 30 mg via ORAL
  Filled 2023-02-02: qty 1

## 2023-02-02 MED ORDER — LABETALOL HCL 5 MG/ML IV SOLN: 80.0000 mg | INTRAVENOUS | Status: DC | PRN

## 2023-02-02 MED ORDER — HYDRALAZINE HCL 20 MG/ML IJ SOLN: 10.0000 mg | INTRAMUSCULAR | Status: DC | PRN

## 2023-02-02 MED ORDER — LABETALOL HCL 5 MG/ML IV SOLN
40.0000 mg | INTRAVENOUS | Status: DC | PRN
  Filled 2023-02-02: qty 8

## 2023-02-02 MED ORDER — NIFEDIPINE ER OSMOTIC RELEASE 30 MG PO TB24
30.0000 mg | ORAL_TABLET | Freq: Two times a day (BID) | ORAL | Status: DC
  Administered 2023-02-03 (×3): 30 mg via ORAL
  Filled 2023-02-02 (×3): qty 1

## 2023-02-02 MED ORDER — LABETALOL HCL 5 MG/ML IV SOLN
20.0000 mg | INTRAVENOUS | Status: DC | PRN
  Administered 2023-02-03: 20 mg via INTRAVENOUS
  Filled 2023-02-02: qty 4

## 2023-02-02 NOTE — Progress Notes (Signed)
Patient ID: Susan [name], female   DOB: 23-Dec-2000, 23 y.o.   MRN: [id number] ACULTY PRACTICE ANTEPARTUM COMPREHENSIVE PROGRESS NOTE  Susan [name] is a 22 y.o. G2P0010 at [redacted]w[redacted]d  who is admitted for [facility] and FGR < 1 % and abnormal fetal doppler studies.   Fetal presentation is cephalic. Length of Stay:  3  Days  Subjective: Pt has no complaints this morning. Denies HA or visual changes. Patient reports good fetal movement.  She reports no uterine contractions, no bleeding and no loss of fluid per vagina.  Vitals:  Blood pressure 130/79, pulse 83, temperature 98.5 F (36.9 C), temperature source Oral, resp. rate 15, height 5\' 8"  (1.727 m), weight 89.5 kg, last menstrual period 07/01/2022, SpO2 97 %.  Physical Examination: Lungs clear Heart RRR Abd soft + BS gravid Ext non tender  Fetal Monitoring:  Baseline: 130 bpm, Variability: Good {> 6 bpm), Accelerations: Reactive, and Decelerations: Absent  Labs:  No results found for this or any previous visit (from the past 24 hour(s)).  Imaging Studies:    NA   Medications:  Scheduled  docusate sodium  100 mg Oral Daily   enoxaparin (LOVENOX) injection  40 mg Subcutaneous Q24H   NIFEdipine  30 mg Oral Daily   prenatal multivitamin  1 tablet Oral Q1200   I have reviewed the patient's current medications.  ASSESSMENT: IUP 30 6/7 weeks SPEC IUGR with abnormal fetal dopplers  PLAN: Stable. S/P Magnesium and BMZ x 2. Will start Procardia qd for better BP control. PEC labs today and q 72 hrs. Fetal doppler studies today. Fetal well being reassuring. Continue with q shift NST. Delivery at 34 weeks or for maternal/fetal indications. In house management until then Continue routine antenatal care.   Hermina Staggers 02/02/2023,8:52 AM

## 2023-02-03 DIAGNOSIS — Z3A31 31 weeks gestation of pregnancy: Secondary | ICD-10-CM

## 2023-02-03 DIAGNOSIS — O1413 Severe pre-eclampsia, third trimester: Secondary | ICD-10-CM | POA: Diagnosis not present

## 2023-02-03 NOTE — Plan of Care (Signed)
  Problem: Education: Goal: Knowledge of disease or condition will improve Outcome: Progressing Goal: Knowledge of the prescribed therapeutic regimen will improve Outcome: Progressing Goal: Individualized Educational Video(s) Outcome: Progressing   Problem: Clinical Measurements: Goal: Complications related to the disease process, condition or treatment will be avoided or minimized Outcome: Progressing   Problem: Education: Goal: Knowledge of General Education information will improve Description: Including pain rating scale, medication(s)/side effects and non-pharmacologic comfort measures Outcome: Progressing   Problem: Health Behavior/Discharge Planning: Goal: Ability to manage health-related needs will improve Outcome: Progressing   Problem: Clinical Measurements: Goal: Ability to maintain clinical measurements within normal limits will improve Outcome: Progressing Goal: Will remain free from infection Outcome: Progressing Goal: Diagnostic test results will improve Outcome: Progressing Goal: Respiratory complications will improve Outcome: Progressing Goal: Cardiovascular complication will be avoided Outcome: Progressing   Problem: Activity: Goal: Risk for activity intolerance will decrease Outcome: Progressing   Problem: Nutrition: Goal: Adequate nutrition will be maintained Outcome: Progressing   Problem: Coping: Goal: Level of anxiety will decrease Outcome: Progressing   Problem: Elimination: Goal: Will not experience complications related to bowel motility Outcome: Progressing Goal: Will not experience complications related to urinary retention Outcome: Progressing   Problem: Pain Managment: Goal: General experience of comfort will improve Outcome: Progressing   Problem: Safety: Goal: Ability to remain free from injury will improve Outcome: Progressing   Problem: Skin Integrity: Goal: Risk for impaired skin integrity will decrease Outcome:  Progressing   Problem: Education: Goal: Knowledge of disease or condition will improve Outcome: Progressing Goal: Knowledge of the prescribed therapeutic regimen will improve Outcome: Progressing   Problem: Fluid Volume: Goal: Peripheral tissue perfusion will improve Outcome: Progressing   Problem: Clinical Measurements: Goal: Complications related to disease process, condition or treatment will be avoided or minimized Outcome: Progressing

## 2023-02-03 NOTE — Progress Notes (Signed)
Patient ID: Susan [name], female   DOB: February 25, 2001, 22 y.o.   MRN: [id number] ACULTY PRACTICE ANTEPARTUM COMPREHENSIVE PROGRESS NOTE  Susan [name] is a 22 y.o. G2P0010 at [redacted]w[redacted]d  who is admitted for [facility] with IGUR and abnormal fetal doppler studies.   Fetal presentation is cephalic. Length of Stay:  4  Days  Subjective: Pt has no complaints this morning. Denies HA or visual changes Patient reports good fetal movement.  She reports no uterine contractions, no bleeding and no loss of fluid per vagina.  Vitals:  Blood pressure (!) 142/83, pulse 85, temperature 98.4 F (36.9 C), temperature source Oral, resp. rate 18, height 5\' 8"  (1.727 m), weight 89.5 kg, last menstrual period 07/01/2022, SpO2 99 %.  Physical Examination: Lungs clear Heart RRR Abd soft + BS gravid non tender Ext non tender  Fetal Monitoring:  Baseline: 140 bpm, Variability: Fair (1-6 bpm), Accelerations: Non-reactive but appropriate for gestational age, and Decelerations: Absent  Labs:  Results for orders placed or performed during the hospital encounter of 01/30/23 (from the past 24 hour(s))  CBC   Collection Time: 02/02/23  9:58 AM  Result Value Ref Range   WBC 14.1 (H) 4.0 - 10.5 K/uL   RBC 4.25 3.87 - 5.11 MIL/uL   Hemoglobin 13.0 12.0 - 15.0 g/dL   HCT 40.9 81.1 - 91.4 %   MCV 93.4 80.0 - 100.0 fL   MCH 30.6 26.0 - 34.0 pg   MCHC 32.7 30.0 - 36.0 g/dL   RDW 78.2 95.6 - 21.3 %   Platelets 229 150 - 400 K/uL   nRBC 0.2 0.0 - 0.2 %  Comprehensive metabolic panel   Collection Time: 02/02/23  9:58 AM  Result Value Ref Range   Sodium 138 135 - 145 mmol/L   Potassium 4.1 3.5 - 5.1 mmol/L   Chloride 108 98 - 111 mmol/L   CO2 23 22 - 32 mmol/L   Glucose, Bld 103 (H) 70 - 99 mg/dL   BUN 14 6 - 20 mg/dL   Creatinine, Ser 0.86 0.44 - 1.00 mg/dL   Calcium 7.7 (L) 8.9 - 10.3 mg/dL   Total Protein 5.1 (L) 6.5 - 8.1 g/dL   Albumin 2.0 (L) 3.5 - 5.0 g/dL   AST 22 15 - 41 U/L   ALT 15 0 - 44 U/L   Alkaline  Phosphatase 74 38 - 126 U/L   Total Bilirubin 0.5 0.3 - 1.2 mg/dL   GFR, Estimated >57 >84 mL/min   Anion gap 7 5 - 15    Imaging Studies:    NA   Medications:  Scheduled  docusate sodium  100 mg Oral Daily   enoxaparin (LOVENOX) injection  40 mg Subcutaneous Q24H   NIFEdipine  30 mg Oral BID   prenatal multivitamin  1 tablet Oral Q1200   I have reviewed the patient's current medications.  ASSESSMENT: IUP 31 0/7 weeks SPEC IUGR with abnormal fetal dopplers  PLAN: Stable. BP still elevated at times. Will increase Procardia. PEC labs normal. Fetal well being reassuring. Repeat dopplers on Monday as per MFM. Delivery at 32-33 weeks as per MFM or for maternal/fetal indications Continue routine antenatal care.   Hermina Staggers 02/03/2023,9:57 AM

## 2023-02-04 DIAGNOSIS — Z3A31 31 weeks gestation of pregnancy: Secondary | ICD-10-CM

## 2023-02-04 MED ORDER — ASPIRIN-ACETAMINOPHEN-CAFFEINE 250-250-65 MG PO TABS
2.0000 | ORAL_TABLET | Freq: Once | ORAL | Status: AC
  Administered 2023-02-04: 2 via ORAL
  Filled 2023-02-04: qty 2

## 2023-02-04 MED ORDER — NIFEDIPINE ER OSMOTIC RELEASE 30 MG PO TB24
30.0000 mg | ORAL_TABLET | Freq: Two times a day (BID) | ORAL | Status: DC
  Administered 2023-02-04 – 2023-02-08 (×7): 30 mg via ORAL
  Filled 2023-02-04 (×8): qty 1

## 2023-02-04 MED ORDER — LABETALOL HCL 200 MG PO TABS
200.0000 mg | ORAL_TABLET | Freq: Three times a day (TID) | ORAL | Status: DC
  Administered 2023-02-04 (×3): 200 mg via ORAL
  Filled 2023-02-04 (×3): qty 1

## 2023-02-04 MED ORDER — NIFEDIPINE ER OSMOTIC RELEASE 60 MG PO TB24: 60.0000 mg | ORAL_TABLET | Freq: Two times a day (BID) | ORAL | Status: DC

## 2023-02-04 MED ORDER — POLYETHYLENE GLYCOL 3350 17 G PO PACK
17.0000 g | PACK | Freq: Every day | ORAL | Status: DC
  Administered 2023-02-04 – 2023-02-09 (×5): 17 g via ORAL
  Filled 2023-02-04 (×6): qty 1

## 2023-02-04 NOTE — Progress Notes (Signed)
Patient unable to maintain a position for tracing of FHR. Notified Dr. Alysia Penna of only obtaining tracing for about 20 minutes this morning without obtaining reactive NST. Dr. Alysia Penna also notified of h/a not relieved by acetaminophen and c/o constipation. Dr. Alysia Penna instructed me to take patient off fetal monitor until this afternoon.

## 2023-02-04 NOTE — Progress Notes (Addendum)
FACULTY PRACTICE ANTEPARTUM COMPREHENSIVE PROGRESS NOTE  Susan [name] is a 22 y.o. G2P0010 at [redacted]w[redacted]d who is admitted for preeclampsia with SF and FGR (1%) w/ absent EDF on UAD.  Estimated Date of Delivery: 04/07/23 Fetal presentation is cephalic.  Length of Stay:  5 Days. Admitted 01/30/2023  Subjective: Doing well with no complaints this morning. Getting headaches since starting procardia, alleviated with tylenol and rest. No vis changes, CP/Sob, RUQ/epigastric pain.  Patient reports good fetal movement.  She reports no uterine contractions, no bleeding and no loss of fluid per vagina.  Vitals:  Blood pressure (!) 151/79, pulse 98, temperature 97.8 F (36.6 C), temperature source Oral, resp. rate 18, height 5\' 8"  (1.727 m), weight 89.5 kg, last menstrual period 07/01/2022, SpO2 100 %. Physical Examination: CONSTITUTIONAL: Well-developed, well-nourished female in no acute distress.  NEUROLOGIC: Alert and oriented to person, place, and time.  CARDIOVASCULAR: Normal heart rate noted RESPIRATORY: Effort normal, no problems with respiration noted ABDOMEN: Soft, nontender, nondistended, gravid. CERVIX:    Fetal monitoring: FHR: 155 bpm, Variability: minimal, Accelerations: Absent, Decelerations: Absent  Uterine activity: flat  Results for orders placed or performed during the hospital encounter of 01/30/23 (from the past 48 hour(s))  CBC     Status: Abnormal   Collection Time: 02/02/23  9:58 AM  Result Value Ref Range   WBC 14.1 (H) 4.0 - 10.5 K/uL   RBC 4.25 3.87 - 5.11 MIL/uL   Hemoglobin 13.0 12.0 - 15.0 g/dL   HCT 40.9 81.1 - 91.4 %   MCV 93.4 80.0 - 100.0 fL   MCH 30.6 26.0 - 34.0 pg   MCHC 32.7 30.0 - 36.0 g/dL   RDW 78.2 95.6 - 21.3 %   Platelets 229 150 - 400 K/uL   nRBC 0.2 0.0 - 0.2 %    Comment: Performed at [facility], [address], [place], [place] [address]  Comprehensive metabolic panel     Status: Abnormal   Collection Time: 02/02/23  9:58 AM  Result Value  Ref Range   Sodium 138 135 - 145 mmol/L   Potassium 4.1 3.5 - 5.1 mmol/L   Chloride 108 98 - 111 mmol/L   CO2 23 22 - 32 mmol/L   Glucose, Bld 103 (H) 70 - 99 mg/dL    Comment: Glucose reference range applies only to samples taken after fasting for at least 8 hours.   BUN 14 6 - 20 mg/dL   Creatinine, Ser 8.46 0.44 - 1.00 mg/dL   Calcium 7.7 (L) 8.9 - 10.3 mg/dL   Total Protein 5.1 (L) 6.5 - 8.1 g/dL   Albumin 2.0 (L) 3.5 - 5.0 g/dL   AST 22 15 - 41 U/L   ALT 15 0 - 44 U/L   Alkaline Phosphatase 74 38 - 126 U/L   Total Bilirubin 0.5 0.3 - 1.2 mg/dL   GFR, Estimated >96 >29 mL/min    Comment: (NOTE) Calculated using the CKD-EPI Creatinine Equation (2021)    Anion gap 7 5 - 15    Comment: Performed at [facility], [address], [place], [place] [address]    [place] MFM UA Cord Doppler  Result Date: 02/02/2023 ----------------------------------------------------------------------  OBSTETRICS REPORT                       (Signed Final 02/02/2023 10:21 am) ---------------------------------------------------------------------- Patient Info  ID #:       [id number]  D.O.B.:  08/22/2001 (21 yrs)  Name:       Susan [name]                 Visit Date: 02/02/2023 08:07 am ---------------------------------------------------------------------- Performed By  Attending:        Braxton Feathers DO       Referred By:      Calvert Cantor  Performed By:     Anabel Halon          [facility] ---------------------------------------------------------------------- Orders  #  Description                           Code        Ordered By  1  [place] MFM UA CORD DOPPLER                76820.02    PEGGY CONSTANT  2  [place] MFM OB LIMITED                     [id number]    PEGGY CONSTANT  ----------------------------------------------------------------------  #  Order #                     Accession #                Episode #  1  [id number]                   [id number]                 [id number]  2  [id number]                   [id number]                 [id number] ---------------------------------------------------------------------- Indications  Maternal care for known or suspected poor      O36.5930  fetal growth, third trimester, not applicable or  unspecified IUGR  Pregnancy resulting from assisted              O09.819  reproductive technology (IVF)  Insufficient Prenatal Care                     O09.30  [redacted] weeks gestation of pregnancy                Z3A.30  LR NIPS, Neg Horizon ---------------------------------------------------------------------- Fetal Evaluation  Num Of Fetuses:         1  Fetal Heart Rate(bpm):  142  Cardiac Activity:       Observed  Presentation:           Cephalic  Placenta:  Posterior Fundal  P. Cord Insertion:      Marginal insertion prev vis  Amniotic Fluid  AFI FV:      Within normal limits  AFI Sum(cm)     %Tile       Largest Pocket(cm)  17.8            66          6.6  RUQ(cm)       RLQ(cm)       LUQ(cm)        LLQ(cm)  4.3           4.3           2.6            6.6 ---------------------------------------------------------------------- OB History  Gravidity:    2         Term:   0        Prem:   0        SAB:   1  TOP:          0       Ectopic:  0        Living: 0 ---------------------------------------------------------------------- Gestational Age  LMP:           30w 6d        Date:  07/01/22                  EDD:   04/07/23  Best:          Georgiann Hahn 6d     Det. By:  LMP  (07/01/22)          EDD:   04/07/23 ---------------------------------------------------------------------- Anatomy  Diaphragm:             Appears normal         Kidneys:                Appear normal  Stomach:               Appears normal, left   Bladder:                Appears normal                          sided ---------------------------------------------------------------------- Doppler - Fetal Vessels  Umbilical Artery   S/D     %tile      RI    %tile      PI    %tile     PSV    ADFV    RDFV                                                     (cm/s)     13   > 97.5     0.9   > 97.5     1.9   > 97.5     37.6     Yes      No ---------------------------------------------------------------------- Cervix Uterus Adnexa  Cervix  Not visualized (advanced GA >24wks)  Uterus  No abnormality visualized.  Right Ovary  Within normal limits.  Left Ovary  Within normal limits.  Cul De Sac  No free fluid seen.  Adnexa  No abnormality visualized ---------------------------------------------------------------------- Comments  Hospital Ultrasound  30w 6d admitted for severe FGR and  PreE getting twice  weekly UA dopplers.  Sonographic findings  Single intrauterine pregnancy.  Fetal cardiac activity: Observed and appears normal.  Presentation: Cephalic.  Limited fetal anatomy appears normal.  Amniotic fluid volume: Within normal limits. AFI: 17.8 cm.  MVP: 6.6 cm.  Placenta: Posterior Fundal. There is no sonographic  evidence of bleeding.  Normal movement and tone.  UA dopplers are persistently absent but no signs of reversed  flow.  Recommendations  - Since UA dopplers are persistently absent, three hour long  NST per day are recommended. She can come off the NST  as long as the tracing is reactive.  - Repeat growth and dopplers Monday  - Delivery around 32-33 or sooner if indicated. Growth and  UA Dopplers again Monday. ----------------------------------------------------------------------                  Braxton Feathers, DO Electronically Signed Final Report   02/02/2023 10:21 am ----------------------------------------------------------------------  [place] MFM OB LIMITED  Result Date: 02/02/2023 ----------------------------------------------------------------------  OBSTETRICS REPORT                       (Signed Final  02/02/2023 10:21 am) ---------------------------------------------------------------------- Patient Info  ID #:       [id number]                          D.O.B.:  June 21, 2001 (21 yrs)  Name:       Susan [name]                 Visit Date: 02/02/2023 08:07 am ---------------------------------------------------------------------- Performed By  Attending:        Braxton Feathers DO       Referred By:      Calvert Cantor  Performed By:     Anabel Halon          [facility] ---------------------------------------------------------------------- Orders  #  Description                           Code        Ordered By  1  [place] MFM UA CORD DOPPLER                76820.02    PEGGY CONSTANT  2  [place] MFM OB LIMITED                     78295.62    PEGGY CONSTANT ----------------------------------------------------------------------  #  Order #                     Accession #  Episode #  1  [id number]                   [id number]                 [id number]  2  [id number]                   [id number]                 [id number] ---------------------------------------------------------------------- Indications  Maternal care for known or suspected poor      O36.5930  fetal growth, third trimester, not applicable or  unspecified IUGR  Pregnancy resulting from assisted              O84.819  reproductive technology (IVF)  Insufficient Prenatal Care                     O09.30  [redacted] weeks gestation of pregnancy                Z3A.30  LR NIPS, Neg Horizon ---------------------------------------------------------------------- Fetal Evaluation  Num Of Fetuses:         1  Fetal Heart Rate(bpm):  142  Cardiac Activity:       Observed  Presentation:           Cephalic  Placenta:               Posterior Fundal  P. Cord Insertion:      Marginal insertion prev vis  Amniotic Fluid  AFI FV:       Within normal limits  AFI Sum(cm)     %Tile       Largest Pocket(cm)  17.8            66          6.6  RUQ(cm)       RLQ(cm)       LUQ(cm)        LLQ(cm)  4.3           4.3           2.6            6.6 ---------------------------------------------------------------------- OB History  Gravidity:    2         Term:   0        Prem:   0        SAB:   1  TOP:          0       Ectopic:  0        Living: 0 ---------------------------------------------------------------------- Gestational Age  LMP:           30w 6d        Date:  07/01/22                  EDD:   04/07/23  Best:          Georgiann Hahn 6d     Det. By:  LMP  (07/01/22)          EDD:   04/07/23 ---------------------------------------------------------------------- Anatomy  Diaphragm:             Appears normal         Kidneys:                Appear normal  Stomach:               Appears normal, left   Bladder:  Appears normal                         sided ---------------------------------------------------------------------- Doppler - Fetal Vessels  Umbilical Artery   S/D     %tile      RI    %tile      PI    %tile     PSV    ADFV    RDFV                                                     (cm/s)     13   > 97.5     0.9   > 97.5     1.9   > 97.5     37.6     Yes      No ---------------------------------------------------------------------- Cervix Uterus Adnexa  Cervix  Not visualized (advanced GA >24wks)  Uterus  No abnormality visualized.  Right Ovary  Within normal limits.  Left Ovary  Within normal limits.  Cul De Sac  No free fluid seen.  Adnexa  No abnormality visualized ---------------------------------------------------------------------- Comments  Hospital Ultrasound  30w 6d admitted for severe FGR and PreE getting twice  weekly UA dopplers.  Sonographic findings  Single intrauterine pregnancy.  Fetal cardiac activity: Observed and appears normal.  Presentation: Cephalic.  Limited fetal anatomy appears normal.  Amniotic fluid volume: Within normal  limits. AFI: 17.8 cm.  MVP: 6.6 cm.  Placenta: Posterior Fundal. There is no sonographic  evidence of bleeding.  Normal movement and tone.  UA dopplers are persistently absent but no signs of reversed  flow.  Recommendations  - Since UA dopplers are persistently absent, three hour long  NST per day are recommended. She can come off the NST  as long as the tracing is reactive.  - Repeat growth and dopplers Monday  - Delivery around 32-33 or sooner if indicated. Growth and  UA Dopplers again Monday. ----------------------------------------------------------------------                  Braxton Feathers, DO Electronically Signed Final Report   02/02/2023 10:21 am ----------------------------------------------------------------------   Current scheduled medications  docusate sodium  100 mg Oral Daily   enoxaparin (LOVENOX) injection  40 mg Subcutaneous Q24H   labetalol  200 mg Oral TID   NIFEdipine  30 mg Oral BID   prenatal multivitamin  1 tablet Oral Q1200    I have reviewed the patient's current medications.  ASSESSMENT: Principal Problem:   Severe preeclampsia, third trimester Active Problems:   IUGR (intrauterine growth restriction) affecting care of mother   Severe preeclampsia   PLAN: PreE w/ SF (BP) - continues to have mild range BP - continue procardia 30 BID, ADD labetalol 200 TID - pt would prefer to add agent rather than increase procardia due to more frequent headaches - serum labs normal, ordered q72h  2. FGR (1%) w/ aEDF - s/p BMZ & mag - TID NSTs x 1h - Next growth/UAD tomorrow - Anticipate delivery at 32-33 weeks, per MFM  Continue routine antenatal care.  Harvie Bridge, MD Obstetrician & Gynecologist, [facility] for [employer], [facility]

## 2023-02-05 ENCOUNTER — Other Ambulatory Visit: Payer: Self-pay

## 2023-02-05 ENCOUNTER — Inpatient Hospital Stay (HOSPITAL_BASED_OUTPATIENT_CLINIC_OR_DEPARTMENT_OTHER): Payer: Medicaid Other

## 2023-02-05 DIAGNOSIS — Z3A31 31 weeks gestation of pregnancy: Secondary | ICD-10-CM | POA: Diagnosis not present

## 2023-02-05 DIAGNOSIS — O365930 Maternal care for other known or suspected poor fetal growth, third trimester, not applicable or unspecified: Secondary | ICD-10-CM | POA: Diagnosis not present

## 2023-02-05 DIAGNOSIS — O1413 Severe pre-eclampsia, third trimester: Secondary | ICD-10-CM

## 2023-02-05 DIAGNOSIS — O0933 Supervision of pregnancy with insufficient antenatal care, third trimester: Secondary | ICD-10-CM

## 2023-02-05 LAB — CBC
HCT: 34.8 % — ABNORMAL LOW (ref 36.0–46.0)
Hemoglobin: 11.5 g/dL — ABNORMAL LOW (ref 12.0–15.0)
MCH: 30.3 pg (ref 26.0–34.0)
MCHC: 33 g/dL (ref 30.0–36.0)
MCV: 91.8 fL (ref 80.0–100.0)
Platelets: 222 10*3/uL (ref 150–400)
RBC: 3.79 MIL/uL — ABNORMAL LOW (ref 3.87–5.11)
RDW: 13.8 % (ref 11.5–15.5)
WBC: 17 10*3/uL — ABNORMAL HIGH (ref 4.0–10.5)
nRBC: 0.1 % (ref 0.0–0.2)

## 2023-02-05 LAB — COMPREHENSIVE METABOLIC PANEL
ALT: 18 U/L (ref 0–44)
AST: 22 U/L (ref 15–41)
Albumin: 1.7 g/dL — ABNORMAL LOW (ref 3.5–5.0)
Alkaline Phosphatase: 69 U/L (ref 38–126)
Anion gap: 8 (ref 5–15)
BUN: 25 mg/dL — ABNORMAL HIGH (ref 6–20)
CO2: 19 mmol/L — ABNORMAL LOW (ref 22–32)
Calcium: 7.6 mg/dL — ABNORMAL LOW (ref 8.9–10.3)
Chloride: 106 mmol/L (ref 98–111)
Creatinine, Ser: 1.06 mg/dL — ABNORMAL HIGH (ref 0.44–1.00)
GFR, Estimated: >60 mL/min (ref >60)
Glucose, Bld: 92 mg/dL (ref 70–99)
Potassium: 4.2 mmol/L (ref 3.5–5.1)
Sodium: 133 mmol/L — ABNORMAL LOW (ref 135–145)
Total Bilirubin: 0.3 mg/dL (ref 0.3–1.2)
Total Protein: 4.3 g/dL — ABNORMAL LOW (ref 6.5–8.1)

## 2023-02-05 LAB — TYPE AND SCREEN
ABO/RH(D): O POS
Antibody Screen: NEGATIVE

## 2023-02-05 MED ORDER — MAGNESIUM SULFATE BOLUS VIA INFUSION
4.0000 g | Freq: Once | INTRAVENOUS | Status: AC
  Administered 2023-02-05: 4 g via INTRAVENOUS
  Filled 2023-02-05: qty 1000

## 2023-02-05 MED ORDER — MAGNESIUM SULFATE 40 GM/1000ML IV SOLN
2.0000 g/h | INTRAVENOUS | Status: DC
  Administered 2023-02-06: 2 g/h via INTRAVENOUS
  Filled 2023-02-05: qty 1000

## 2023-02-05 MED ORDER — LABETALOL HCL 200 MG PO TABS
400.0000 mg | ORAL_TABLET | Freq: Three times a day (TID) | ORAL | Status: DC
  Administered 2023-02-05 – 2023-02-07 (×7): 400 mg via ORAL
  Filled 2023-02-05 (×8): qty 2

## 2023-02-05 MED ORDER — ONDANSETRON 4 MG PO TBDP
4.0000 mg | ORAL_TABLET | Freq: Four times a day (QID) | ORAL | Status: DC | PRN
  Administered 2023-02-05: 4 mg via ORAL
  Filled 2023-02-05: qty 1

## 2023-02-05 MED ORDER — LACTATED RINGERS IV SOLN: INTRAVENOUS | Status: DC

## 2023-02-05 MED ORDER — SALINE SPRAY 0.65 % NA SOLN
1.0000 | NASAL | Status: DC | PRN
  Filled 2023-02-05: qty 44

## 2023-02-05 NOTE — Progress Notes (Signed)
FACULTY PRACTICE ANTEPARTUM COMPREHENSIVE PROGRESS NOTE  Susan [name] is a 22 y.o. G2P0010 at [redacted]w[redacted]d who is admitted for preeclampsia with SF and FGR (1%) w/ absent EDF on UAD.  Estimated Date of Delivery: 04/07/23 Fetal presentation is cephalic.  Length of Stay:  6 Days. Admitted 01/30/2023  Subjective: Doing well with no complaints this AM. Patient reports good fetal movement.  She reports no uterine contractions, no bleeding and no loss of fluid per vagina.  Vitals:  Blood pressure (!) 151/92, pulse 93, temperature 98 F (36.7 C), temperature source Oral, resp. rate 18, height 5\' 8"  (1.727 m), weight 89.5 kg, last menstrual period 07/01/2022, SpO2 100 %. Physical Examination: CONSTITUTIONAL: Well-developed, well-nourished female in no acute distress.  CARDIOVASCULAR: Normal heart rate noted, regular rhythm RESPIRATORY: Effort normal, no problems with respiration noted ABDOMEN: Soft, nontender, nondistended, gravid.  Fetal monitoring: FHR: 140 bpm, Variability: minimal with periods of moderate, Accelerations: Absent, Decelerations: Occasional bowl shaped decels & variables, then resolved x 60 minutes and pt removed from monitor Uterine activity: 0 contractions per hour  No results found for this or any previous visit (from the past 48 hour(s)).  No results found.  Current scheduled medications  docusate sodium  100 mg Oral Daily   enoxaparin (LOVENOX) injection  40 mg Subcutaneous Q24H   labetalol  400 mg Oral TID   NIFEdipine  30 mg Oral BID   polyethylene glycol  17 g Oral Daily   prenatal multivitamin  1 tablet Oral Q1200   I have reviewed the patient's current medications.  ASSESSMENT: Principal Problem:   Severe preeclampsia, third trimester Active Problems:   IUGR (intrauterine growth restriction) affecting care of mother   Severe preeclampsia   PLAN: PreE w/ SF (BP) - continues to have mild range BP - continue procardia 30 BID, ADD labetalol 200 TID - pt would  prefer to add agent rather than increase procardia due to more frequent headaches - serum labs normal, ordered q72h - due today   2. FGR (1%) w/ aEDF - s/p BMZ & mag - TID NSTs x 1h - Growth/UAD scheduled for today - Anticipate delivery at 32-33 weeks, per MFM   Continue routine antenatal care.   Harvie Bridge, MD Obstetrician & Gynecologist, [facility] for [employer], [facility]

## 2023-02-05 NOTE — Consult Note (Signed)
MFM Note  Susan [name] has been hospitalized due to severe preeclampsia with an IUGR fetus.  She is currently at 31 weeks and 2 days.  Absent end-diastolic flow had been noted on her umbilical artery Doppler studies last week.  She reports that she is experiencing nausea and vomiting today.  Her PIH labs today showed an increase in her serum creatinine level to 1.06.  Her AST and ALT levels were within normal limits.  Her platelet count is within normal limits.  On today's ultrasound exam, the EFW of 2 pounds 9 ounces measures at the < 1  percentile for her gestational age indicating severe IUGR.  The fetus showed a 5 ounce growth over the last 2 weeks.  The total AFI was 13.5 cm (within normal limits).  The fetus is in the vertex presentation.  Doppler studies of the umbilical arteries showed persistent reversed end-diastolic flow.  Due to severe preeclampsia with IUGR and reversed end-diastolic flow, delivery is recommended.  The patient last received her prophylactic Lovenox at around 10:30 AM this morning.    While we are awaiting the effects of Lovenox to wear off, she should be started on magnesium sulfate for fetal neuroprotection.    An induction of labor may be started 12 hours following her last dose of prophylactic Lovenox.  As the fetus is in the vertex presentation, an attempt at a vaginal delivery should be tried.    However, the patient understands that many growth restricted fetuses with abnormally functioning placentas as indicated by the reversed end-diastolic flow noted on her umbilical artery Doppler studies, may not be able to tolerate the labor process and may need a cesarean delivery.  The patient and her mother stated they are comfortable with the plan to proceed with delivery later today.    They stated that all of their questions were answered.

## 2023-02-06 ENCOUNTER — Inpatient Hospital Stay (HOSPITAL_COMMUNITY): Payer: Medicaid Other | Admitting: Anesthesiology

## 2023-02-06 ENCOUNTER — Other Ambulatory Visit: Payer: Self-pay

## 2023-02-06 ENCOUNTER — Encounter (HOSPITAL_COMMUNITY)
Admission: AD | Disposition: A | Discharge status: Home / Self Care | Payer: Self-pay | Source: Home / Self Care | Attending: Obstetrics and Gynecology

## 2023-02-06 ENCOUNTER — Encounter (HOSPITAL_COMMUNITY): Payer: Self-pay | Admitting: Family Medicine

## 2023-02-06 ENCOUNTER — Ambulatory Visit: Payer: Medicaid Other

## 2023-02-06 DIAGNOSIS — O1414 Severe pre-eclampsia complicating childbirth: Secondary | ICD-10-CM

## 2023-02-06 DIAGNOSIS — Z3A31 31 weeks gestation of pregnancy: Secondary | ICD-10-CM

## 2023-02-06 DIAGNOSIS — O365930 Maternal care for other known or suspected poor fetal growth, third trimester, not applicable or unspecified: Secondary | ICD-10-CM

## 2023-02-06 DIAGNOSIS — O4423 Partial placenta previa NOS or without hemorrhage, third trimester: Secondary | ICD-10-CM

## 2023-02-06 LAB — CBC
HCT: 38.8 % (ref 36.0–46.0)
Hemoglobin: 13 g/dL (ref 12.0–15.0)
MCH: 30.9 pg (ref 26.0–34.0)
MCHC: 33.5 g/dL (ref 30.0–36.0)
MCV: 92.2 fL (ref 80.0–100.0)
Platelets: 263 10*3/uL (ref 150–400)
RBC: 4.21 MIL/uL (ref 3.87–5.11)
RDW: 14 % (ref 11.5–15.5)
WBC: 17.8 10*3/uL — ABNORMAL HIGH (ref 4.0–10.5)
nRBC: 0 % (ref 0.0–0.2)

## 2023-02-06 LAB — MAGNESIUM: Magnesium: 5.9 mg/dL — ABNORMAL HIGH (ref 1.7–2.4)

## 2023-02-06 SURGERY — Surgical Case
Anesthesia: Spinal | Site: Abdomen

## 2023-02-06 MED ORDER — OXYCODONE HCL 5 MG PO TABS
5.0000 mg | ORAL_TABLET | ORAL | Status: DC | PRN
  Administered 2023-02-06: 5 mg via ORAL
  Administered 2023-02-08 (×2): 10 mg via ORAL
  Administered 2023-02-09: 5 mg via ORAL
  Filled 2023-02-06: qty 2
  Filled 2023-02-06: qty 1
  Filled 2023-02-06: qty 2
  Filled 2023-02-06: qty 1

## 2023-02-06 MED ORDER — ONDANSETRON HCL 4 MG/2ML IJ SOLN: 4.0000 mg | Freq: Three times a day (TID) | INTRAMUSCULAR | Status: DC | PRN

## 2023-02-06 MED ORDER — KETOROLAC TROMETHAMINE 30 MG/ML IJ SOLN: 30.0000 mg | Freq: Once | INTRAMUSCULAR | Status: DC | PRN

## 2023-02-06 MED ORDER — DIPHENHYDRAMINE HCL 25 MG PO CAPS: 25.0000 mg | ORAL_CAPSULE | Freq: Four times a day (QID) | ORAL | Status: DC | PRN

## 2023-02-06 MED ORDER — DIPHENHYDRAMINE HCL 25 MG PO CAPS: 25.0000 mg | ORAL_CAPSULE | ORAL | Status: DC | PRN

## 2023-02-06 MED ORDER — LABETALOL HCL 5 MG/ML IV SOLN: 20.0000 mg | INTRAVENOUS | Status: DC | PRN

## 2023-02-06 MED ORDER — HYDROMORPHONE HCL 1 MG/ML IJ SOLN: 0.2000 mg | INTRAMUSCULAR | Status: DC | PRN

## 2023-02-06 MED ORDER — ACETAMINOPHEN 10 MG/ML IV SOLN
INTRAVENOUS | Status: DC | PRN
  Administered 2023-02-06: 1000 mg via INTRAVENOUS

## 2023-02-06 MED ORDER — LACTATED RINGERS IV SOLN: INTRAVENOUS | Status: DC

## 2023-02-06 MED ORDER — COCONUT OIL OIL: 1.0000 | TOPICAL_OIL | Status: DC | PRN

## 2023-02-06 MED ORDER — MAGNESIUM SULFATE 40 GM/1000ML IV SOLN
INTRAVENOUS | Status: AC
  Filled 2023-02-06: qty 1000

## 2023-02-06 MED ORDER — SENNOSIDES-DOCUSATE SODIUM 8.6-50 MG PO TABS
2.0000 | ORAL_TABLET | Freq: Every day | ORAL | Status: DC
  Administered 2023-02-07 – 2023-02-09 (×3): 2 via ORAL
  Filled 2023-02-06 (×3): qty 2

## 2023-02-06 MED ORDER — KETOROLAC TROMETHAMINE 30 MG/ML IJ SOLN: 30.0000 mg | Freq: Four times a day (QID) | INTRAMUSCULAR | Status: DC | PRN

## 2023-02-06 MED ORDER — ENOXAPARIN SODIUM 40 MG/0.4ML IJ SOSY
40.0000 mg | PREFILLED_SYRINGE | INTRAMUSCULAR | Status: DC
  Administered 2023-02-07 – 2023-02-09 (×3): 40 mg via SUBCUTANEOUS
  Filled 2023-02-06 (×3): qty 0.4

## 2023-02-06 MED ORDER — PHENYLEPHRINE HCL-NACL 20-0.9 MG/250ML-% IV SOLN
INTRAVENOUS | Status: AC
  Filled 2023-02-06: qty 250

## 2023-02-06 MED ORDER — MORPHINE SULFATE (PF) 0.5 MG/ML IJ SOLN
INTRAMUSCULAR | Status: DC | PRN
  Administered 2023-02-06: .15 mg via INTRATHECAL

## 2023-02-06 MED ORDER — FUROSEMIDE 20 MG PO TABS
20.0000 mg | ORAL_TABLET | Freq: Every day | ORAL | Status: DC
  Administered 2023-02-06 – 2023-02-09 (×4): 20 mg via ORAL
  Filled 2023-02-06 (×4): qty 1

## 2023-02-06 MED ORDER — ONDANSETRON HCL 4 MG/2ML IJ SOLN
INTRAMUSCULAR | Status: DC | PRN
  Administered 2023-02-06: 4 mg via INTRAVENOUS

## 2023-02-06 MED ORDER — NALOXONE HCL 4 MG/10ML IJ SOLN: 1.0000 ug/kg/h | INTRAVENOUS | Status: DC | PRN

## 2023-02-06 MED ORDER — ACETAMINOPHEN 10 MG/ML IV SOLN
INTRAVENOUS | Status: AC
  Filled 2023-02-06: qty 100

## 2023-02-06 MED ORDER — MENTHOL 3 MG MT LOZG: 1.0000 | LOZENGE | OROMUCOSAL | Status: DC | PRN

## 2023-02-06 MED ORDER — MEPERIDINE HCL 25 MG/ML IJ SOLN: 6.2500 mg | INTRAMUSCULAR | Status: DC | PRN

## 2023-02-06 MED ORDER — OXYTOCIN-SODIUM CHLORIDE 30-0.9 UT/500ML-% IV SOLN
1.0000 m[IU]/min | INTRAVENOUS | Status: DC
  Administered 2023-02-06: 2 m[IU]/min via INTRAVENOUS
  Filled 2023-02-06: qty 500

## 2023-02-06 MED ORDER — DIPHENHYDRAMINE HCL 50 MG/ML IJ SOLN
12.5000 mg | INTRAMUSCULAR | Status: DC | PRN
  Administered 2023-02-06: 12.5 mg via INTRAVENOUS
  Filled 2023-02-06: qty 1

## 2023-02-06 MED ORDER — LABETALOL HCL 5 MG/ML IV SOLN: 40.0000 mg | INTRAVENOUS | Status: DC | PRN

## 2023-02-06 MED ORDER — SIMETHICONE 80 MG PO CHEW
80.0000 mg | CHEWABLE_TABLET | Freq: Three times a day (TID) | ORAL | Status: DC
  Administered 2023-02-06 – 2023-02-09 (×10): 80 mg via ORAL
  Filled 2023-02-06 (×9): qty 1

## 2023-02-06 MED ORDER — SCOPOLAMINE 1 MG/3DAYS TD PT72
MEDICATED_PATCH | TRANSDERMAL | Status: AC
  Filled 2023-02-06: qty 1

## 2023-02-06 MED ORDER — BUPIVACAINE IN DEXTROSE 0.75-8.25 % IT SOLN
INTRATHECAL | Status: DC | PRN
  Administered 2023-02-06: 1.7 mL via INTRATHECAL

## 2023-02-06 MED ORDER — SIMETHICONE 80 MG PO CHEW: 80.0000 mg | CHEWABLE_TABLET | ORAL | Status: DC | PRN

## 2023-02-06 MED ORDER — ACETAMINOPHEN 500 MG PO TABS
1000.0000 mg | ORAL_TABLET | Freq: Four times a day (QID) | ORAL | Status: DC
  Administered 2023-02-06 – 2023-02-08 (×8): 1000 mg via ORAL
  Filled 2023-02-06 (×8): qty 2

## 2023-02-06 MED ORDER — SODIUM CHLORIDE 0.9% FLUSH: 3.0000 mL | INTRAVENOUS | Status: DC | PRN

## 2023-02-06 MED ORDER — FENTANYL CITRATE (PF) 100 MCG/2ML IJ SOLN
INTRAMUSCULAR | Status: DC | PRN
  Administered 2023-02-06: 15 ug via INTRATHECAL

## 2023-02-06 MED ORDER — SODIUM CHLORIDE 0.9 % IV SOLN
500.0000 mg | INTRAVENOUS | Status: AC
  Administered 2023-02-06: 500 mg via INTRAVENOUS
  Filled 2023-02-06: qty 5

## 2023-02-06 MED ORDER — ZOLPIDEM TARTRATE 5 MG PO TABS: 5.0000 mg | ORAL_TABLET | Freq: Every evening | ORAL | Status: DC | PRN

## 2023-02-06 MED ORDER — WITCH HAZEL-GLYCERIN EX PADS: 1.0000 | MEDICATED_PAD | CUTANEOUS | Status: DC | PRN

## 2023-02-06 MED ORDER — STERILE WATER FOR IRRIGATION IR SOLN
Status: DC | PRN
  Administered 2023-02-06: 1000 mL

## 2023-02-06 MED ORDER — CEFAZOLIN SODIUM-DEXTROSE 2-4 GM/100ML-% IV SOLN
2.0000 g | INTRAVENOUS | Status: AC
  Administered 2023-02-06: 2 g via INTRAVENOUS
  Filled 2023-02-06: qty 100

## 2023-02-06 MED ORDER — SODIUM CHLORIDE 0.9 % IR SOLN
Status: DC | PRN
  Administered 2023-02-06: 1000 mL

## 2023-02-06 MED ORDER — OXYTOCIN-SODIUM CHLORIDE 30-0.9 UT/500ML-% IV SOLN
INTRAVENOUS | Status: AC
  Filled 2023-02-06: qty 500

## 2023-02-06 MED ORDER — DEXAMETHASONE SODIUM PHOSPHATE 10 MG/ML IJ SOLN
INTRAMUSCULAR | Status: DC | PRN
  Administered 2023-02-06: 10 mg via INTRAVENOUS

## 2023-02-06 MED ORDER — ONDANSETRON HCL 4 MG/2ML IJ SOLN
INTRAMUSCULAR | Status: AC
  Filled 2023-02-06: qty 2

## 2023-02-06 MED ORDER — PENICILLIN G POT IN DEXTROSE 60000 UNIT/ML IV SOLN
3.0000 10*6.[IU] | INTRAVENOUS | Status: DC
  Administered 2023-02-06: 3 10*6.[IU] via INTRAVENOUS
  Filled 2023-02-06: qty 50

## 2023-02-06 MED ORDER — OXYCODONE HCL 5 MG PO TABS: 5.0000 mg | ORAL_TABLET | Freq: Once | ORAL | Status: DC | PRN

## 2023-02-06 MED ORDER — SCOPOLAMINE 1 MG/3DAYS TD PT72
1.0000 | MEDICATED_PATCH | Freq: Once | TRANSDERMAL | Status: AC
  Administered 2023-02-06: 1.5 mg via TRANSDERMAL

## 2023-02-06 MED ORDER — DEXAMETHASONE SODIUM PHOSPHATE 10 MG/ML IJ SOLN
INTRAMUSCULAR | Status: AC
  Filled 2023-02-06: qty 1

## 2023-02-06 MED ORDER — HYDRALAZINE HCL 20 MG/ML IJ SOLN: 10.0000 mg | INTRAMUSCULAR | Status: DC | PRN

## 2023-02-06 MED ORDER — SOD CITRATE-CITRIC ACID 500-334 MG/5ML PO SOLN
30.0000 mL | ORAL | Status: AC
  Administered 2023-02-06: 30 mL via ORAL
  Filled 2023-02-06: qty 30

## 2023-02-06 MED ORDER — PHENYLEPHRINE HCL (PRESSORS) 10 MG/ML IV SOLN
INTRAVENOUS | Status: DC | PRN
  Administered 2023-02-06 (×2): 80 ug via INTRAVENOUS

## 2023-02-06 MED ORDER — OXYTOCIN-SODIUM CHLORIDE 30-0.9 UT/500ML-% IV SOLN
2.5000 [IU]/h | INTRAVENOUS | Status: AC
  Administered 2023-02-06: 2.5 [IU]/h via INTRAVENOUS

## 2023-02-06 MED ORDER — AMISULPRIDE (ANTIEMETIC) 5 MG/2ML IV SOLN: 10.0000 mg | Freq: Once | INTRAVENOUS | Status: DC | PRN

## 2023-02-06 MED ORDER — ONDANSETRON HCL 4 MG/2ML IJ SOLN: 4.0000 mg | Freq: Once | INTRAMUSCULAR | Status: DC | PRN

## 2023-02-06 MED ORDER — CEFAZOLIN SODIUM-DEXTROSE 2-4 GM/100ML-% IV SOLN
INTRAVENOUS | Status: AC
  Filled 2023-02-06: qty 100

## 2023-02-06 MED ORDER — KETOROLAC TROMETHAMINE 30 MG/ML IJ SOLN
INTRAMUSCULAR | Status: AC
  Filled 2023-02-06: qty 1

## 2023-02-06 MED ORDER — MORPHINE SULFATE (PF) 0.5 MG/ML IJ SOLN
INTRAMUSCULAR | Status: AC
  Filled 2023-02-06: qty 10

## 2023-02-06 MED ORDER — DIBUCAINE (PERIANAL) 1 % EX OINT: 1.0000 | TOPICAL_OINTMENT | CUTANEOUS | Status: DC | PRN

## 2023-02-06 MED ORDER — OXYCODONE HCL 5 MG/5ML PO SOLN: 5.0000 mg | Freq: Once | ORAL | Status: DC | PRN

## 2023-02-06 MED ORDER — LABETALOL HCL 5 MG/ML IV SOLN: 80.0000 mg | INTRAVENOUS | Status: DC | PRN

## 2023-02-06 MED ORDER — SODIUM CHLORIDE 0.9 % IV SOLN
5.0000 10*6.[IU] | Freq: Once | INTRAVENOUS | Status: AC
  Administered 2023-02-06: 5 10*6.[IU] via INTRAVENOUS
  Filled 2023-02-06: qty 5

## 2023-02-06 MED ORDER — FENTANYL CITRATE (PF) 100 MCG/2ML IJ SOLN
INTRAMUSCULAR | Status: AC
  Filled 2023-02-06: qty 2

## 2023-02-06 MED ORDER — FUROSEMIDE 10 MG/ML IJ SOLN
20.0000 mg | Freq: Once | INTRAMUSCULAR | Status: AC
  Administered 2023-02-06: 20 mg via INTRAVENOUS
  Filled 2023-02-06: qty 2

## 2023-02-06 MED ORDER — IBUPROFEN 600 MG PO TABS
600.0000 mg | ORAL_TABLET | Freq: Four times a day (QID) | ORAL | Status: DC
  Administered 2023-02-07 – 2023-02-08 (×4): 600 mg via ORAL
  Filled 2023-02-06 (×4): qty 1

## 2023-02-06 MED ORDER — PRENATAL MULTIVITAMIN CH
1.0000 | ORAL_TABLET | Freq: Every day | ORAL | Status: DC
  Administered 2023-02-07 – 2023-02-09 (×3): 1 via ORAL
  Filled 2023-02-06 (×3): qty 1

## 2023-02-06 MED ORDER — HYDROMORPHONE HCL 1 MG/ML IJ SOLN: 0.2500 mg | INTRAMUSCULAR | Status: DC | PRN

## 2023-02-06 MED ORDER — TERBUTALINE SULFATE 1 MG/ML IJ SOLN: 0.2500 mg | Freq: Once | INTRAMUSCULAR | Status: DC | PRN

## 2023-02-06 MED ORDER — NALOXONE HCL 0.4 MG/ML IJ SOLN: 0.4000 mg | INTRAMUSCULAR | Status: DC | PRN

## 2023-02-06 MED ORDER — OXYTOCIN-SODIUM CHLORIDE 30-0.9 UT/500ML-% IV SOLN
INTRAVENOUS | Status: DC | PRN
  Administered 2023-02-06: 200 mL via INTRAVENOUS

## 2023-02-06 MED ORDER — KETOROLAC TROMETHAMINE 30 MG/ML IJ SOLN
30.0000 mg | Freq: Four times a day (QID) | INTRAMUSCULAR | Status: AC
  Administered 2023-02-06 – 2023-02-07 (×3): 30 mg via INTRAVENOUS
  Filled 2023-02-06 (×3): qty 1

## 2023-02-06 MED ORDER — PHENYLEPHRINE HCL-NACL 20-0.9 MG/250ML-% IV SOLN
INTRAVENOUS | Status: DC | PRN
  Administered 2023-02-06: 30 ug/min via INTRAVENOUS

## 2023-02-06 MED ORDER — ACETAMINOPHEN 500 MG PO TABS: 1000.0000 mg | ORAL_TABLET | Freq: Four times a day (QID) | ORAL | Status: DC

## 2023-02-06 MED ORDER — MAGNESIUM SULFATE 40 GM/1000ML IV SOLN: 2.0000 g/h | INTRAVENOUS | Status: AC

## 2023-02-06 SURGICAL SUPPLY — 42 items
ADH SKN CLS APL DERMABOND .7 (GAUZE/BANDAGES/DRESSINGS) ×2
APL PRP STRL LF DISP 70% ISPRP (MISCELLANEOUS) ×2
APL SKNCLS STERI-STRIP NONHPOA (GAUZE/BANDAGES/DRESSINGS) ×1
BENZOIN TINCTURE PRP APPL 2/3 (GAUZE/BANDAGES/DRESSINGS) IMPLANT
CHLORAPREP W/TINT 26 (MISCELLANEOUS) ×2 IMPLANT
CLAMP UMBILICAL CORD (MISCELLANEOUS) ×1 IMPLANT
CLOTH BEACON ORANGE TIMEOUT ST (SAFETY) ×1 IMPLANT
DERMABOND ADVANCED .7 DNX12 (GAUZE/BANDAGES/DRESSINGS) ×2 IMPLANT
DRSG OPSITE POSTOP 4X10 (GAUZE/BANDAGES/DRESSINGS) ×1 IMPLANT
ELECT REM PT RETURN 9FT ADLT (ELECTROSURGICAL) ×1
ELECTRODE REM PT RTRN 9FT ADLT (ELECTROSURGICAL) ×1 IMPLANT
EXTRACTOR VACUUM M CUP 4 TUBE (SUCTIONS) IMPLANT
GAUZE PAD ABD 7.5X8 STRL (GAUZE/BANDAGES/DRESSINGS) IMPLANT
GAUZE SPONGE 4X4 12PLY STRL LF (GAUZE/BANDAGES/DRESSINGS) IMPLANT
GLOVE BIOGEL PI IND STRL 7.0 (GLOVE) ×2 IMPLANT
GLOVE BIOGEL PI IND STRL 7.5 (GLOVE) ×2 IMPLANT
GLOVE ECLIPSE 7.5 STRL STRAW (GLOVE) ×1 IMPLANT
GOWN STRL REUS W/TWL LRG LVL3 (GOWN DISPOSABLE) ×3 IMPLANT
HEMOSTAT ARISTA ABSORB 3G PWDR (HEMOSTASIS) IMPLANT
KIT ABG SYR 3ML LUER SLIP (SYRINGE) IMPLANT
NDL HYPO 25X5/8 SAFETYGLIDE (NEEDLE) IMPLANT
NEEDLE HYPO 25X5/8 SAFETYGLIDE (NEEDLE) IMPLANT
NS IRRIG 1000ML POUR BTL (IV SOLUTION) ×1 IMPLANT
PACK C SECTION WH (CUSTOM PROCEDURE TRAY) ×1 IMPLANT
PAD OB MATERNITY 4.3X12.25 (PERSONAL CARE ITEMS) ×1 IMPLANT
RETRACTOR WND ALEXIS 25 LRG (MISCELLANEOUS) IMPLANT
RTRCTR C-SECT PINK 25CM LRG (MISCELLANEOUS) ×1 IMPLANT
RTRCTR WOUND ALEXIS 25CM LRG (MISCELLANEOUS) ×1
STRIP CLOSURE SKIN 1/2X4 (GAUZE/BANDAGES/DRESSINGS) IMPLANT
SUT MNCRL 0 VIOLET CTX 36 (SUTURE) ×2 IMPLANT
SUT MON AB 4-0 PS1 27 (SUTURE) IMPLANT
SUT VIC AB 0 CT1 27 (SUTURE) ×1
SUT VIC AB 0 CT1 27XBRD ANBCTR (SUTURE) ×1 IMPLANT
SUT VIC AB 0 CTX 36 (SUTURE) ×1
SUT VIC AB 0 CTX36XBRD ANBCTRL (SUTURE) ×1 IMPLANT
SUT VIC AB 2-0 CT1 27 (SUTURE) ×1
SUT VIC AB 2-0 CT1 TAPERPNT 27 (SUTURE) ×1 IMPLANT
SUT VIC AB 4-0 KS 27 (SUTURE) ×1 IMPLANT
TAPE CLOTH SURG 4X10 WHT LF (GAUZE/BANDAGES/DRESSINGS) IMPLANT
TOWEL OR 17X24 6PK STRL BLUE (TOWEL DISPOSABLE) ×1 IMPLANT
TRAY FOLEY W/BAG SLVR 14FR LF (SET/KITS/TRAYS/PACK) ×1 IMPLANT
WATER STERILE IRR 1000ML POUR (IV SOLUTION) ×1 IMPLANT

## 2023-02-06 NOTE — Anesthesia Preprocedure Evaluation (Addendum)
Anesthesia Evaluation  Patient identified by MRN, date of birth, ID band Patient awake    Reviewed: Allergy & Precautions, NPO status , Patient's Chart, lab work & pertinent test results  Airway Mallampati: II  TM Distance: >3 FB Neck ROM: Full    Dental no notable dental hx.    Pulmonary neg pulmonary ROS   Pulmonary exam normal breath sounds clear to auscultation       Cardiovascular hypertension (severe preE), Normal cardiovascular exam Rhythm:Regular Rate:Normal     Neuro/Psych negative neurological ROS  negative psych ROS   GI/Hepatic negative GI ROS, Neg liver ROS,,,  Endo/Other  negative endocrine ROS    Renal/GU negative Renal ROS  negative genitourinary   Musculoskeletal negative musculoskeletal ROS (+)    Abdominal   Peds negative pediatric ROS (+)  Hematology  (+) Blood dyscrasia, anemia Hb 11.5, plt 222   Anesthesia Other Findings   Reproductive/Obstetrics (+) Pregnancy 31 3/7 IOL for preE with SF & FGR (<1%) w/ rEDF: CST prior to induction process performed and fetus not tolerating- decision for nonemergent section Inc Cr to 1.06 today                              Anesthesia Physical Anesthesia Plan  ASA: 3  Anesthesia Plan: Spinal   Post-op Pain Management: Toradol IV (intra-op)* and Ofirmev IV (intra-op)*   Induction:   PONV Risk Score and Plan: 2 and Ondansetron, Dexamethasone and Treatment may vary due to age or medical condition  Airway Management Planned: Natural Airway  Additional Equipment: None  Intra-op Plan:   Post-operative Plan:   Informed Consent: I have reviewed the patients History and Physical, chart, labs and discussed the procedure including the risks, benefits and alternatives for the proposed anesthesia with the patient or authorized representative who has indicated his/her understanding and acceptance.       Plan Discussed with:  CRNA  Anesthesia Plan Comments:        Anesthesia Quick Evaluation

## 2023-02-06 NOTE — Anesthesia Postprocedure Evaluation (Signed)
Anesthesia Post Note  Patient: Susan [name]  Procedure(s) Performed: CESAREAN SECTION (Abdomen)     Patient location during evaluation: Nursing Unit Anesthesia Type: Spinal Level of consciousness: oriented and awake and alert Pain management: pain level controlled Vital Signs Assessment: post-procedure vital signs reviewed and stable Respiratory status: spontaneous breathing and respiratory function stable Cardiovascular status: blood pressure returned to baseline and stable Postop Assessment: no headache, no backache, no apparent nausea or vomiting and patient able to bend at knees Anesthetic complications: no  No notable events documented.  Last Vitals:  Vitals:   02/06/23 0930 02/06/23 1015  BP: 127/75 129/84  Pulse: 78 84  Resp: 15 16  Temp: (!) 36.4 C 36.7 C  SpO2: 97%     Last Pain:  Vitals:   02/06/23 1015  TempSrc: Oral  PainSc: 0-No pain                 Trevor Iha

## 2023-02-06 NOTE — Op Note (Signed)
CASSANDR HAURI PROCEDURE DATE: 02/06/2023  PREOPERATIVE DIAGNOSES: Intrauterine pregnancy at [redacted]w[redacted]d weeks gestation; non-reassuring fetal status  POSTOPERATIVE DIAGNOSES: The same  PROCEDURE: Primary Low Transverse Cesarean Section  SURGEON:  Dr. Harvie Bridge  ASSISTANT:  Dr. Randa Evens Autry-Lott  ANESTHESIOLOGY TEAM: Anesthesiologist: Lannie Fields, DO; Richardson Landry Tera Mater, MD CRNA: Rhymer, Doree Fudge, CRNA  INDICATIONS: Susan [name] is a 22 y.o. G2P0010 at [redacted]w[redacted]d here for cesarean section secondary to the indications listed under preoperative diagnoses; please see preoperative note for further details.  The risks of surgery were discussed with the patient including but were not limited to: bleeding which may require transfusion or reoperation; infection which may require antibiotics; injury to bowel, bladder, ureters or other surrounding organs; injury to the fetus; need for additional procedures including hysterectomy in the event of a life-threatening hemorrhage; formation of adhesions; placental abnormalities wth subsequent pregnancies; incisional problems; thromboembolic phenomenon and other postoperative/anesthesia complications.  The patient concurred with the proposed plan, giving informed written consent for the procedure.    FINDINGS:  Viable female infant in cephalic presentation.  Apgars 6 and 9.  Amniotic fluid: clear.  Intact placenta, three vessel cord.  Normal uterus, fallopian tubes and ovaries bilaterally.  ANESTHESIA: spinal INTRAVENOUS FLUIDS: 600 ml   ESTIMATED BLOOD LOSS: 418 ml URINE OUTPUT:  150 ml SPECIMENS: Placenta sent to pathology  COMPLICATIONS: None immediate  PROCEDURE IN DETAIL:  The patient preoperatively received intravenous antibiotics and had sequential compression devices applied to her lower extremities.  She was then taken to the operating room where spinal anesthesia was found to be adequate. She was then placed in a dorsal supine position  with a leftward tilt, and prepped and draped in a sterile manner.  A foley catheter was  placed into her bladder and attached to constant gravity.  After an adequate timeout was performed, a Pfannenstiel skin incision was made with scalpel and carried through to the underlying layer of fascia. The fascia was incised in the midline, and this incision was extended bluntly. The rectus muscles were separated in the midline and the peritoneum was entered bluntly. Significant peritoneal fluid encountered. Bladder thoroughly inspected and intact.    The Alexis self-retaining retractor was introduced into the abdominal cavity.  Attention was turned to the lower uterine segment where a low transverse hysterotomy was made with a scalpel and extended bilaterally bluntly.  The infant was successfully delivered, the cord was clamped and cut after 45 seconds, and the infant was handed over to the awaiting neonatology team. Uterine massage was then administered, and the placenta delivered intact with a three-vessel cord. The uterus was then cleared of clots and debris.  The hysterotomy was closed with 0 Monocryl in a running fashion. A second imbricating layer of 0-monocryl was placed in order to achieve hemostasis.  The pelvis was cleared of all clot and debris.  Additional areas of serosa were cauterized with Bovie. Hemostasis was confirmed on all surfaces. Arista was placed. The retractor was removed.  The peritoneum was closed with a 2-0 Vicryl running stitch. The fascia was then closed using 0 Vicryl in a running fashion.  The subcutaneous layer was irrigated, and was found to be hemostatic and any areas of bleeding were cauterized with the bovie. The skin was closed with a 4-0 monocryl subcuticular stitch. The patient tolerated the procedure well. Sponge, instrument and needle counts were correct x 3.  She was taken to the recovery room in stable condition.   Harvie Bridge, MD  Obstetrician & Gynecologist, Nurse, mental health for [employer], [facility]

## 2023-02-06 NOTE — Progress Notes (Signed)
Decision to CS Note  Ms. Agredano is a 21yo G2P0010 at [redacted]w[redacted]d who was admitted and is being induced for severe preE & FGR (<1%) w/ rEDF.   CST was attempted with low dose pitocin. Pit titrated to 53ml/h. Tracing with minimal variability, no accels, and recurrent late decelerations with spontaneous bowl shaped decelerations as well.   Discussed with patient that she has NRFHT with recurrent late decelerations very remote from delivery. Counseled her that is does not appear that her fetus will be able to tolerate stress of labor induction and recommended cesarean delivery for non-reassuring fetal status remote from delivery. The risks of surgery were discussed including but not limited to: need for classical hysterotomy with need for cesarean section with all subsequent pregnancies, bleeding which may require transfusion, reoperation, or additional procedures like hysterectomy in the event of life-threatening hemorrhage; infection which may require antibiotics; injury to bowel, bladder, ureters or other surrounding organs; injury to the fetus; formation of adhesions; placental abnormalities with subsequent pregnancies; incisional problems; thromboembolic phenomenon and other postoperative/anesthesia complications.    The patient concurred with the proposed plan, giving informed written consent for the procedure. Patient last ate a sub at 10pm. She will remain NPO for procedure. Anesthesia and OR aware. Preoperative prophylactic antibiotics and SCDs ordered on call to the OR. To OR when ready/appropriate from NPO standpoint.

## 2023-02-06 NOTE — Transfer of Care (Signed)
Immediate Anesthesia Transfer of Care Note  Patient: Susan [name]  Procedure(s) Performed: CESAREAN SECTION (Abdomen)  Patient Location: PACU  Anesthesia Type:Spinal  Level of Consciousness: awake, alert , and oriented  Airway & Oxygen Therapy: Patient Spontanous Breathing  Post-op Assessment: Report given to RN and Post -op Vital signs reviewed and stable  Post vital signs: Reviewed and stable  Last Vitals:  Vitals Value Taken Time  BP 127/82 02/06/23 0850  Temp    Pulse 85 02/06/23 0854  Resp 15 02/06/23 0854  SpO2 97 % 02/06/23 0854  Vitals shown include unvalidated device data.  Last Pain:  Vitals:   02/06/23 0702  TempSrc: Oral  PainSc:       Patients Stated Pain Goal: 3 (02/03/23 2300)  Complications: No notable events documented.

## 2023-02-06 NOTE — Progress Notes (Signed)
RN & NT at bedside for 1st time standing/walking. Orthostatic blood pressure obtained. Patient tolerated poorly with a decrease in blood pressure upon standing to 66/50. Patient stated having dizziness/nausea. Assisted patient to lying position, applied ice to back of neck. Initial 3 min. recheck 120/73 P 68. Patient states she is feeling better.

## 2023-02-06 NOTE — Anesthesia Procedure Notes (Signed)
Spinal  Patient location during procedure: OR Start time: 02/06/2023 7:08 AM End time: 02/06/2023 7:13 AM Reason for block: surgical anesthesia Staffing Performed: anesthesiologist  Anesthesiologist: Lannie Fields, DO Performed by: Lannie Fields, DO Authorized by: Lannie Fields, DO   Preanesthetic Checklist Completed: patient identified, IV checked, risks and benefits discussed, surgical consent, monitors and equipment checked, pre-op evaluation and timeout performed Spinal Block Patient position: sitting Prep: DuraPrep and site prepped and draped Patient monitoring: cardiac monitor, continuous pulse ox and blood pressure Approach: midline Location: L3-4 Injection technique: single-shot Needle Needle type: Pencan  Needle gauge: 24 G Needle length: 9 cm Assessment Sensory level: T6 Events: CSF return Additional Notes Functioning IV was confirmed and monitors were applied. Sterile prep and drape, including hand hygiene and sterile gloves were used. The patient was positioned and the spine was prepped. The skin was anesthetized with lidocaine.  Free flow of clear CSF was obtained prior to injecting local anesthetic into the CSF.  The spinal needle aspirated freely following injection.  The needle was carefully withdrawn.  The patient tolerated the procedure well.

## 2023-02-06 NOTE — Progress Notes (Addendum)
Labor Progress Note SAANVIKA MCPHAUL is a 22 y.o. G2P0010 at [redacted]w[redacted]d presented for IOL 2/2 severe preE with severe FGR w/ persistent  reversed end-diastolic flow.  S: No acute concerns.  O:  BP (!) 149/94   Pulse 88   Temp 97.7 F (36.5 C) (Oral)   Resp 17   Ht 5\' 8"  (1.727 m)   Wt 89.5 kg   LMP 07/01/2022 (Exact Date)   SpO2 99%   BMI 30.01 kg/m  EFM: 125bpm/min to mod/ no accels, no decels  CVE:  n/a  Pt informed that the ultrasound is considered a limited OB ultrasound and is not intended to be a complete ultrasound exam.  Patient also informed that the ultrasound is not being completed with the intent of assessing for fetal or placental anomalies or any pelvic abnormalities.  Explained that the purpose of today's ultrasound is to assess for  presentation.  Patient acknowledges the purpose of the exam and the limitations of the study.    Vertex confirmed on [place] per Dr. Berton Lan  A&P: 22 y.o. G2P0010 [redacted]w[redacted]d here for IOL 2/2 severe preE with severe FGR w/ persistent reversed end-diastolic flow. #Labor: Will perform CST prior to induction process.  #Pain: Planning for epidural #FWB: Cat II due to mininmal variability at times, overall reassuring at this time #GBS not done, PCN ppx  preE w/ SF BP mild range. Asymptomatic at this time. S/p mag for seizure PPX. Restarted today per MFM for neuro protection. S/p BMZ x2.   Jagjit Riner Autry-Lott, DO 12:03 AM

## 2023-02-07 LAB — CBC
HCT: 33.2 % — ABNORMAL LOW (ref 36.0–46.0)
HCT: 34.2 % — ABNORMAL LOW (ref 36.0–46.0)
Hemoglobin: 10.9 g/dL — ABNORMAL LOW (ref 12.0–15.0)
Hemoglobin: 11.2 g/dL — ABNORMAL LOW (ref 12.0–15.0)
MCH: 30.3 pg (ref 26.0–34.0)
MCH: 31.1 pg (ref 26.0–34.0)
MCHC: 32.7 g/dL (ref 30.0–36.0)
MCHC: 32.8 g/dL (ref 30.0–36.0)
MCV: 92.4 fL (ref 80.0–100.0)
MCV: 94.9 fL (ref 80.0–100.0)
Platelets: 235 10*3/uL (ref 150–400)
Platelets: 235 10*3/uL (ref 150–400)
RBC: 3.5 MIL/uL — ABNORMAL LOW (ref 3.87–5.11)
RBC: 3.7 MIL/uL — ABNORMAL LOW (ref 3.87–5.11)
RDW: 13.8 % (ref 11.5–15.5)
RDW: 14.1 % (ref 11.5–15.5)
WBC: 21.5 10*3/uL — ABNORMAL HIGH (ref 4.0–10.5)
WBC: 23.7 10*3/uL — ABNORMAL HIGH (ref 4.0–10.5)
nRBC: 0 % (ref 0.0–0.2)
nRBC: 0 % (ref 0.0–0.2)

## 2023-02-07 LAB — MAGNESIUM: Magnesium: 7.8 mg/dL (ref 1.7–2.4)

## 2023-02-07 NOTE — Progress Notes (Signed)
Chaplain attempted initial visit to introduce spiritual care and offer support in the setting of a 8 day length of stay with a newborn baby in NICU. Pt was sleeping. Will continue to follow.  Please page as further needs arise.  Maryanna Shape. Carley Hammed, M.Div. [facility] Chaplain Pager [phone] Office [phone]

## 2023-02-07 NOTE — Lactation Note (Signed)
This note was copied from a baby's chart.  NICU Lactation Consultation Note  Patient Name: Girl Junia Pyun [id number] Date: 02/07/2023 Age:23 hours  Reason for consult: Initial assessment; NICU baby; Preterm <34wks; Infant < 6lbs; 1st time breastfeeding; Primapara; Other (Comment) (Maternal pre-eclampsia)  SUBJECTIVE  LC in and assisted with Mom's 2nd pumping at 7 hrs post delivery (C/Section).  Mom desires to breastfeed her baby.  Reviewed breast massage and hand expression for drops.   Information on the importance of consistent breast stimulation to support a full milk supply shared.  Mom provided with storage bottles and breast milk labels to record date and time of expression.  Explained to Mom that every drop is important and baby would be given this colostrum due to it's immuno protective properties.  Mom does not have a DEBP for home use.  She just acquired Medicaid (pregnancy), and plans to apply for [facility].  [facility] referral sent to [facility].  Reassured Mom that baby's room in the NICU has a Medela DEBP for her to use.  Reviewed how to care of pump parts, bins provided for washing and drying.  Mom provided with a pumping log as she requested.  OBJECTIVE Infant data: Mother's Current Feeding Choice: Breast Milk and Donor Milk  Infant feeding assessment No data recorded  Maternal data: G2P0111  C-Section, Low Transverse Has patient been taught Hand Expression?: Yes Hand Expression Comments: colostrum drops noted Significant Breast History:: ++ breast changes Current breast feeding challenges:: Infant separation Does the patient have breastfeeding experience prior to this delivery?: No Pumping frequency: Mom assisted to pump for 2nd time, and will consistently pump every 2-3 hrs. Pumped volume: 0 mL (drops) Flange Size: 21 Risk factor for low milk supply:: Infant separation/infant transferred to NICU  [facility] Program: No WIC Referral Sent?: Yes What county?:  [place]  ASSESSMENT Infant: Feeding Status: Scheduled 8-11-2-5  Maternal: Milk volume: Normal  INTERVENTIONS/PLAN Interventions: Interventions: Breast feeding basics reviewed; Skin to skin; Breast massage; Hand express; DEBP; LC Services brochure Tools: Pump; Flanges  Plan: Consult Status: NICU follow-up NICU Follow-up type: New admission follow up   Judee Clara 02/07/2023, 8:19 AM

## 2023-02-07 NOTE — Progress Notes (Signed)
Subjective: Postpartum Day 1: Cesarean Delivery Patient reports feeling well. She denies HA, visual changes. RUQ/epigastric pain, nausea or emesis. She ambulated and voided. She tolerated a regular diet    Objective: Vital signs in last 24 hours: Temp:  [97.4 F (36.3 C)-98.4 F (36.9 C)] 98.4 F (36.9 C) (05/15 0738) Pulse Rate:  [67-89] 85 (05/15 0738) Resp:  [9-18] 17 (05/15 0738) BP: (66-140)/(50-90) 121/68 (05/15 0738) SpO2:  [96 %-98 %] 97 % (05/15 0738)  Physical Exam:  General: alert, cooperative, and no distress Lochia: appropriate Uterine Fundus: firm Incision: dressing is clean and intact DVT Evaluation: No evidence of DVT seen on physical exam.  Recent Labs    02/06/23 2357 02/07/23 0648  HGB 11.2* 10.9*  HCT 34.2* 33.2*    Assessment/Plan: Status post Cesarean section. Doing well postoperatively.  Patient is hemodynamically stable Magnesium sulfate completed for seizure prophylaxis BP stable on lasix and nifedipine Continue routine postpartum care  Catalina Antigua, MD 02/07/2023, 8:34 AM

## 2023-02-07 NOTE — Lactation Note (Signed)
This note was copied from a baby's chart.  NICU Lactation Consultation Note  Patient Name: Girl Raniyah Axford [id number] Date: 02/07/2023 Age:22  Reason for consult: Follow-up assessment; NICU baby; Primapara; 1st time breastfeeding; Preterm <34wks; Infant < 6lbs  SUBJECTIVE  LC in to visit with P1 Mom of preterm infant delivered by C/Section.  Mom has been consistently pumping, setting her phone alarm, and getting small volumes of milk.   Pump parts noted in bag hanging on pump.  LC took the parts, disassembled them and washed, rinsed and set in separate bin to air dry, teaching Mom while doing this.  LC provided Mom with a hand's free pumping band, size Medium. Engorgement prevention and treatment reviewed. Encouraged STS in the NICU when allowed and consistent pumping.  Reviewed normal expectations regarding milk coming to volume.   OBJECTIVE Infant data: Mother's Current Feeding Choice: Breast Milk and Donor Milk  Infant feeding assessment No data recorded  Maternal data: G2P0111  C-Section, Low Transverse Has patient been taught Hand Expression?: Yes Hand Expression Comments: colostrum drops noted Significant Breast History:: ++ breast changes Current breast feeding challenges:: Infant separation Does the patient have breastfeeding experience prior to this delivery?: No Pumping frequency: Every 3 hrs, sets an alarm on her phone Pumped volume: 1 mL Flange Size: 21 Risk factor for low milk supply:: Infant separation/infant transferred to NICU  WIC Program: No WIC Referral Sent?: Yes What county?: [place] Pump:  (Spoke with rep from [facility], to obtain a WIC pump when discharged 5/16)  ASSESSMENT Infant: Feeding Status: Scheduled 8-11-2-5  Maternal: Milk volume: Normal  INTERVENTIONS/PLAN Interventions: Interventions: Breast feeding basics reviewed; Skin to skin; Breast massage; Hand express; DEBP; LC Services brochure Discharge Education:  Engorgement and breast care Tools: Pump; Flanges; Hands-free pumping top (Medium size top) Pump Education: Setup, frequency, and cleaning; Milk Storage  Plan: Consult Status: NICU follow-up NICU Follow-up type: Verify onset of copious milk; Verify absence of engorgement   Judee Clara 02/07/2023, 9:47 AM

## 2023-02-08 ENCOUNTER — Encounter (HOSPITAL_COMMUNITY): Payer: Self-pay | Admitting: Family Medicine

## 2023-02-08 DIAGNOSIS — Z975 Presence of (intrauterine) contraceptive device: Secondary | ICD-10-CM

## 2023-02-08 DIAGNOSIS — N179 Acute kidney failure, unspecified: Secondary | ICD-10-CM

## 2023-02-08 DIAGNOSIS — Z30017 Encounter for initial prescription of implantable subdermal contraceptive: Secondary | ICD-10-CM

## 2023-02-08 HISTORY — PX: INSERTION OF IMPLANON ROD: OBO 1005

## 2023-02-08 HISTORY — DX: Acute kidney failure, unspecified: N17.9

## 2023-02-08 LAB — COMPREHENSIVE METABOLIC PANEL
ALT: 20 U/L (ref 0–44)
AST: 25 U/L (ref 15–41)
Albumin: 1.7 g/dL — ABNORMAL LOW (ref 3.5–5.0)
Alkaline Phosphatase: 66 U/L (ref 38–126)
Anion gap: 7 (ref 5–15)
BUN: 21 mg/dL — ABNORMAL HIGH (ref 6–20)
CO2: 23 mmol/L (ref 22–32)
Calcium: 7.3 mg/dL — ABNORMAL LOW (ref 8.9–10.3)
Chloride: 106 mmol/L (ref 98–111)
Creatinine, Ser: 1.02 mg/dL — ABNORMAL HIGH (ref 0.44–1.00)
GFR, Estimated: >60 mL/min (ref >60)
Glucose, Bld: 92 mg/dL (ref 70–99)
Potassium: 4.8 mmol/L (ref 3.5–5.1)
Sodium: 136 mmol/L (ref 135–145)
Total Bilirubin: 0.2 mg/dL — ABNORMAL LOW (ref 0.3–1.2)
Total Protein: 4.4 g/dL — ABNORMAL LOW (ref 6.5–8.1)

## 2023-02-08 LAB — SURGICAL PATHOLOGY

## 2023-02-08 MED ORDER — ETONOGESTREL 68 MG ~~LOC~~ IMPL
68.0000 mg | DRUG_IMPLANT | Freq: Once | SUBCUTANEOUS | Status: AC
  Administered 2023-02-08: 68 mg via SUBCUTANEOUS
  Filled 2023-02-08 (×2): qty 1

## 2023-02-08 MED ORDER — LIDOCAINE HCL 1 % IJ SOLN
0.0000 mL | Freq: Once | INTRAMUSCULAR | Status: AC | PRN
  Administered 2023-02-08: 3 mL via INTRADERMAL
  Filled 2023-02-08: qty 20

## 2023-02-08 MED ORDER — NIFEDIPINE ER OSMOTIC RELEASE 30 MG PO TB24
30.0000 mg | ORAL_TABLET | Freq: Every day | ORAL | Status: DC
  Administered 2023-02-09: 30 mg via ORAL
  Filled 2023-02-08: qty 1

## 2023-02-08 MED ORDER — IBUPROFEN 600 MG PO TABS
600.0000 mg | ORAL_TABLET | Freq: Four times a day (QID) | ORAL | Status: DC | PRN
  Administered 2023-02-09: 600 mg via ORAL
  Filled 2023-02-08: qty 1

## 2023-02-08 NOTE — Progress Notes (Signed)
Daily Postpartum Note  Admission Date: 01/30/2023 Current Date: 02/08/2023 10:05 AM  Susan [name] is a 22 y.o. [id number] POD#2 s/p PLTCS at [redacted]w[redacted]d, admitted for severe pre-eclampsia (BPs).  Pregnancy complicated by: Patient Active Problem List   Diagnosis Date Noted   IUGR (intrauterine growth restriction) affecting care of mother 01/30/2023   Severe preeclampsia 01/30/2023   Severe preeclampsia, third trimester 01/30/2023   Encounter for antenatal screening for fetal growth restriction 01/23/2023   Marginal insertion of umbilical cord affecting management of mother 01/23/2023   Proteinuria affecting pregnancy in third trimester 01/15/2023   Supervision of normal pregnancy, antepartum 10/19/2022   LGSIL on Pap smear of cervix 12/16/2021    Overnight/24hr events:  none  Subjective:  Meeting all PP/PO goals, no BM yet. No s/s of pre-eclampsia  Objective:    Current Vital Signs 24h Vital Sign Ranges  T 98.1 F (36.7 C) Temp  Avg: 98.1 F (36.7 C)  Min: 97.5 F (36.4 C)  Max: 98.9 F (37.2 C)  BP 125/72 BP  Min: 106/54  Max: 131/67  HR 94 Pulse  Avg: 93.8  Min: 89  Max: 98  RR 20 Resp  Avg: 18.2  Min: 17  Max: 20  SaO2 98 % Room Air SpO2  Avg: 98.8 %  Min: 98 %  Max: 100 %       24 Hour I/O Current Shift I/O  Time Ins Outs 05/15 0701 - 05/16 0700 In: 720 [P.O.:720] Out: 800 [Urine:800] No intake/output data recorded.   Patient Vitals for the past 24 hrs:  BP Temp Temp src Pulse Resp SpO2  02/08/23 0757 125/72 98.1 F (36.7 C) Oral 94 20 98 %  02/08/23 0442 126/72 98.3 F (36.8 C) Oral 89 18 100 %  02/08/23 0148 (!) 113/57 97.9 F (36.6 C) Oral 98 17 --  02/07/23 2235 (!) 115/54 -- -- 93 -- --  02/07/23 1943 (!) 106/54 (!) 97.5 F (36.4 C) Oral 95 18 99 %  02/07/23 1539 131/67 98.9 F (37.2 C) Oral 94 18 98 %   Physical exam: General: Well nourished, well developed female in no acute distress. Abdomen: soft, nttp, mild to moderatly distended. C/d/I  dressing Cardiovascular: S1, S2 normal, no murmur, rub or gallop, regular rate and rhythm Respiratory: CTAB Extremities: no clubbing, cyanosis or edema Skin: Warm and dry.   Medications: Current Facility-Administered Medications  Medication Dose Route Frequency Provider Last Rate Last Admin   acetaminophen (TYLENOL) tablet 1,000 mg  1,000 mg Oral Q6H Harvie Bridge C, MD   1,000 mg at 02/08/23 4540   acetaminophen (TYLENOL) tablet 650 mg  650 mg Oral Q4H PRN Federico Flake, MD   650 mg at 02/03/23 2219   calcium carbonate (TUMS - dosed in mg elemental calcium) chewable tablet 400 mg of elemental calcium  2 tablet Oral Q4H PRN Federico Flake, MD       coconut oil  1 Application Topical PRN Lennart Pall, MD       witch hazel-glycerin (TUCKS) pad 1 Application  1 Application Topical PRN Lennart Pall, MD       And   dibucaine (NUPERCAINAL) 1 % rectal ointment 1 Application  1 Application Rectal PRN Lennart Pall, MD       diphenhydrAMINE (BENADRYL) injection 12.5 mg  12.5 mg Intravenous Q4H PRN Lacy Duverney M, DO   12.5 mg at 02/06/23 1413   Or   diphenhydrAMINE (BENADRYL) capsule 25 mg  25 mg Oral  Q4H PRN Lacy Duverney M, DO       diphenhydrAMINE (BENADRYL) capsule 25 mg  25 mg Oral Q6H PRN Lennart Pall, MD       docusate sodium (COLACE) capsule 100 mg  100 mg Oral Daily Federico Flake, MD   100 mg at 02/08/23 0958   enoxaparin (LOVENOX) injection 40 mg  40 mg Subcutaneous Q24H Harvie Bridge C, MD   40 mg at 02/08/23 0809   furosemide (LASIX) tablet 20 mg  20 mg Oral Daily Lennart Pall, MD   20 mg at 02/08/23 0959   labetalol (NORMODYNE) injection 20 mg  20 mg Intravenous PRN Lennart Pall, MD       And   labetalol (NORMODYNE) injection 40 mg  40 mg Intravenous PRN Lennart Pall, MD       And   labetalol (NORMODYNE) injection 80 mg  80 mg Intravenous PRN Lennart Pall, MD       And   hydrALAZINE  (APRESOLINE) injection 10 mg  10 mg Intravenous PRN Lennart Pall, MD       HYDROmorphone (DILAUDID) injection 0.2-0.6 mg  0.2-0.6 mg Intravenous Q2H PRN Lennart Pall, MD       ibuprofen (ADVIL) tablet 600 mg  600 mg Oral Q6H Lennart Pall, MD   600 mg at 02/08/23 6045   lactated ringers infusion   Intravenous Continuous Federico Flake, MD   Stopped at 01/31/23 1855   lactated ringers infusion   Intravenous Continuous Lennart Pall, MD   Stopped at 02/07/23 0110   menthol-cetylpyridinium (CEPACOL) lozenge 3 mg  1 lozenge Oral Q2H PRN Lennart Pall, MD       naloxone [facility]) injection 0.4 mg  0.4 mg Intravenous PRN Lacy Duverney M, DO       And   sodium chloride flush (NS) 0.9 % injection 3 mL  3 mL Intravenous PRN Lacy Duverney M, DO       naloxone HCl (NARCAN) 2 mg in dextrose 5 % 250 mL infusion  1-2 mcg/kg/hr Intravenous Continuous PRN Lacy Duverney M, DO       NIFEdipine (PROCARDIA-XL/NIFEDICAL-XL) 24 hr tablet 30 mg  30 mg Oral BID Harvie Bridge C, MD   30 mg at 02/08/23 1000   ondansetron (ZOFRAN) injection 4 mg  4 mg Intravenous Q8H PRN Lacy Duverney M, DO       oxyCODONE (Oxy IR/ROXICODONE) immediate release tablet 5-10 mg  5-10 mg Oral Q4H PRN Harvie Bridge C, MD   10 mg at 02/08/23 0816   polyethylene glycol (MIRALAX / GLYCOLAX) packet 17 g  17 g Oral Daily Harvie Bridge C, MD   17 g at 02/08/23 1000   prenatal multivitamin tablet 1 tablet  1 tablet Oral Q1200 Harvie Bridge C, MD   1 tablet at 02/07/23 1200   scopolamine (TRANSDERM-SCOP) 1 MG/3DAYS 1.5 mg  1 patch Transdermal Once Lannie Fields, DO   1.5 mg at 02/06/23 4098   senna-docusate (Senokot-S) tablet 2 tablet  2 tablet Oral Daily Lennart Pall, MD   2 tablet at 02/08/23 1000   simethicone (MYLICON) chewable tablet 80 mg  80 mg Oral TID PC Harvie Bridge C, MD   80 mg at 02/08/23 0809   simethicone (MYLICON) chewable tablet 80 mg  80  mg Oral PRN Lennart Pall, MD       sodium chloride (OCEAN) 0.65 % nasal spray 1 spray  1 spray Each  Nare PRN Lennart Pall, MD       zolpidem [facility]) tablet 5 mg  5 mg Oral QHS PRN Lennart Pall, MD        Labs:  Recent Labs  Lab 02/06/23 0243 02/06/23 2357 02/07/23 0648  WBC 17.8* 23.7* 21.5*  HGB 13.0 11.2* 10.9*  HCT 38.8 34.2* 33.2*  PLT 263 235 235    Recent Labs  Lab 02/02/23 0958 02/05/23 0853  NA 138 133*  K 4.1 4.2  CL 108 106  CO2 23 19*  BUN 14 25*  CREATININE 0.86 1.06*  CALCIUM 7.7* 7.6*  PROT 5.1* 4.3*  BILITOT 0.5 0.3  ALKPHOS 74 69  ALT 15 18  AST 22 22  GLUCOSE 103* 92   Radiology:  No new imaging  Assessment & Plan:  Patient doing well *PP/postop: routine care. Continue on BM regimen. Breast. Girl. O POS/rpr neg *Severe pre-eclampsia: s/p 24h post op Mg. Rpt Cr today. Continue on lasix. Labetalol d/c'ed yesterday. Will decrease procardia to qday from bid *PPx: lovenox, oob ad lib *Dispo: likely tomorrow. Request sent for 1wk bp and incision check.   Cornelia Copa MD Attending Center for [facility] (Faculty Practice) GYN Consult Phone: [phone] (M-F, [phone]) & [phone]  (Off hours, weekends, holidays)

## 2023-02-08 NOTE — Lactation Note (Signed)
This note was copied from a baby's chart.  NICU Lactation Consultation Note  Patient Name: Susan [name] ZOXWR'U Date: 02/08/2023 Age:23 hours  Reason for consult: Follow-up assessment; NICU baby; Primapara; 1st time breastfeeding; Infant < 6lbs; Preterm <34wks  SUBJECTIVE Visited with family of 35 hours old pre-term NICU female; Susan [name] is a P1 and reports she's been pumping mostly every 3 hours and her milk is in this morning, praised her for her efforts. Her plan is to take baby directly to breast in addition to pumping and bottle feeding. She doesn't know when she'll be discharged, but previous LC has already sent a referral and spoken to [facility] rep for pump issuance post maternal discharge. Reviewed pumping schedule, pump settings, lactogenesis II. IDF 1/2 and anticipatory guidelines.   OBJECTIVE Infant data: Mother's Current Feeding Choice: Breast Milk and Donor Milk  Maternal data: [id number]  C-Section, Low Transverse Current breast feeding challenges:: NICU admission Pumping frequency: 4-5 times/24 hours Pumped volume: 45 mL Flange Size: 21 Risk factor for low milk supply:: primipara, prematurity, 418 cc maternal blood loss, infant separation  WIC Program: No WIC Referral Sent?: Yes What county?: [place] Pump:  (Spoke with rep from [facility], to obtain a WIC pump when discharged 5/16)  ASSESSMENT Infant: Feeding Status: Scheduled 9-12-3-6  Maternal: Milk volume: Normal  INTERVENTIONS/PLAN Interventions: Interventions: Breast feeding basics reviewed; DEBP; Education Discharge Education: Engorgement and breast care Tools: Pump; Flanges; Hands-free pumping top (Medium) Pump Education: Setup, frequency, and cleaning; Milk Storage  Plan: Encouraged pumping every 3 hours, ideally 8 pumping sessions/24 hours Breast massage and hand expression were also encouraged prior pumping Parents will try doing as much STS care as they can  No other  support person at this time. All questions and concerns answered, family to contact [facility] services PRN.  Consult Status: NICU follow-up NICU Follow-up type: Maternal D/C visit   Susan [name] 02/08/2023, 3:42 PM

## 2023-02-08 NOTE — Procedures (Signed)
Nexplanon Insertion Procedure Note Prior to the procedure being performed, a "time out" was performed by the physician that confirmed the correct patient, procedure and site.  After informed consent was obtained, the patient's non-dominant left arm was chosen for insertion; I used the prior site of the past two nexplanons. The site was approximately 8 cm proximal to the medial epicondyle in the sulcus between the biceps and triceps on the inner surface. The area was cleaned with alcohol then local anesthesia was infiltrated with 3 ml of 1% lidocaine along the planned insertion track. The area was prepped with betadine. Using sterile technique the Nexplanon device was inserted per manufacturer's guidelines in the subdermal connective tissue using the standard insertion technique without difficulty. Pressure was applied and the insertion site was hemostatic. The presence of the Nexplanon was confirmed immediately after insertion by palpation by both me and the patient and by checking the tip of needle for the absence of the insert.  A pressure dressing was applied.  Lot #: [id number] Exp: 2026-04  The patient tolerated the procedure well.  Cornelia Copa MD Attending Center for [employer] Midwife)

## 2023-02-09 ENCOUNTER — Other Ambulatory Visit (HOSPITAL_COMMUNITY): Payer: Self-pay

## 2023-02-09 ENCOUNTER — Encounter (HOSPITAL_COMMUNITY): Payer: Self-pay | Admitting: Obstetrics and Gynecology

## 2023-02-09 MED ORDER — FUROSEMIDE 20 MG PO TABS
20.0000 mg | ORAL_TABLET | Freq: Two times a day (BID) | ORAL | 0 refills | Status: DC
  Filled 2023-02-09: qty 6, 3d supply, fill #0

## 2023-02-09 MED ORDER — NIFEDIPINE ER 30 MG PO TB24
30.0000 mg | ORAL_TABLET | Freq: Every day | ORAL | 2 refills | Status: DC
  Filled 2023-02-09: qty 30, 30d supply, fill #0

## 2023-02-09 MED ORDER — OXYCODONE HCL 5 MG PO TABS
5.0000 mg | ORAL_TABLET | ORAL | 0 refills | Status: DC | PRN
  Filled 2023-02-09: qty 30, 3d supply, fill #0

## 2023-02-09 MED ORDER — IBUPROFEN 600 MG PO TABS
600.0000 mg | ORAL_TABLET | Freq: Four times a day (QID) | ORAL | 0 refills | Status: DC | PRN
  Filled 2023-02-09: qty 30, 8d supply, fill #0

## 2023-02-09 MED ORDER — FUROSEMIDE 20 MG PO TABS
20.0000 mg | ORAL_TABLET | Freq: Two times a day (BID) | ORAL | 1 refills | Status: DC
  Filled 2023-02-09: qty 30, 15d supply, fill #0

## 2023-02-09 NOTE — Lactation Note (Signed)
This note was copied from a baby's chart.  NICU Lactation Consultation Note  Patient Name: Susan [name] [id number] Date: 02/09/2023 Age:23 hours  Reason for consult: Follow-up assessment; Primapara; 1st time breastfeeding; NICU baby; Preterm <34wks; Infant < 6lbs; Maternal discharge  SUBJECTIVE Visited with family of 23 hours old pre-term NICU female; Ms. Mauel is a P1 and reports she's pumping consistently, her volumes continue to increase, praised her for her efforts. She voiced that she might have skipped a couple of times because she didn't feel her breasts getting "full". Explained the importance of emptying her breast around the clock for the onset of lactogenesis III. Her plan is to take baby to breast once she's ready along with pumping and bottle feeding. She's getting discharged today. Reviewed discharge education, engorgement prevention/treatment, lactogenesis II/III, pumping schedule, benefits of STS care and anticipatory guidelines.  OBJECTIVE Infant data: Mother's Current Feeding Choice: Breast Milk and Donor Milk  Maternal data: [id number]  C-Section, Low Transverse Current breast feeding challenges:: NICU admission Pumping frequency: 6 times/24 hours Pumped volume: 120 mL Flange Size: 21 Risk factor for low milk supply:: primipara, prematurity, 418 cc maternal blood loss, infant separation  WIC Program: No WIC Referral Sent?: Yes What county?: [place] Pump:  (Spoke with rep from [facility], to obtain a WIC pump when discharged 5/16)  ASSESSMENT Infant: Feeding Status: Scheduled 9-12-3-6  Maternal: Milk volume: Abundant  INTERVENTIONS/PLAN Interventions: Interventions: Breast feeding basics reviewed; DEBP; Education Discharge Education: Engorgement and breast care Tools: Pump; Flanges; Hands-free pumping top (Medium) Pump Education: Setup, frequency, and cleaning; Milk Storage  Plan: Encouraged to continue pumping every 3 hours on maintenance  mode, ideally 8 pumping sessions/24 hours She'll take all pump parts to baby's room after her discharge She'll go to the [facility] office @ [place] to P/U her pump on 02/09/2023 Parents will try doing as much STS care as they can She'll call for assistance when baby is ready to go to breast   No other support person at this time. All questions and concerns answered, family to contact [facility] services PRN.  Consult Status: NICU follow-up NICU Follow-up type: Verify absence of engorgement; Weekly NICU follow up   Garrie Woodin S Philis Nettle 02/09/2023, 11:21 AM

## 2023-02-09 NOTE — Discharge Summary (Signed)
Physician Discharge Summary  Patient ID: HADLEA ACCOMANDO MRN: [id number] DOB/AGE: [birth year] 22 y.o.  Admit date: 01/30/2023 Discharge date: 02/09/2023  Admission Diagnoses: Severe pre eclampsia FGR MCI  Discharge Diagnoses:  Principal Problem:   Severe preeclampsia, third trimester Active Problems:   Marginal insertion of umbilical cord affecting management of mother   IUGR (intrauterine growth restriction) affecting care of mother   Severe preeclampsia   AKI (acute kidney injury) (HCC)   Nexplanon in place   Discharged Condition: stable  Hospital Course:  Admitted thru MAU 01/30/23@[redacted]w[redacted]d  [place] with known FGR 1% UAD IAEDF, known MCI Admitted with Dx: severe pre eclampsia with severe features(CNS sx) Received BMZ x2 + MgSO4 prophylaxis Her BP was managed with procardia and labetalol with titrating doses Testing on 02/05/23 revelaed REDF, NR NST and +late decels with pitocin which led to decision to proceedw with LTCS for NRFHRT am 02/06/23  Consults:   Significant Diagnostic Studies: labs:  and sonograms  Treatments: BP management, BMZ x2, Magnesium prophylaxis, Caesarean section(NR FHRT)  Discharge Exam: Blood pressure (!) 142/85, pulse 95, temperature 98.5 F (36.9 C), temperature source Oral, resp. rate 16, height 5\' 8"  (1.727 m), weight 89.5 kg, last menstrual period 07/01/2022, SpO2 99 %, unknown if currently breastfeeding. General appearance: alert, cooperative, and no distress Abdomen is soft non tender Bandage is in place  Disposition: Discharge disposition: 01-Home or Self Care       Discharge Instructions     Call MD for:  persistant nausea and vomiting   Complete by: As directed    Call MD for:  severe uncontrolled pain   Complete by: As directed    Call MD for:  temperature >100.4   Complete by: As directed    Diet - low sodium heart healthy   Complete by: As directed    Driving Restrictions   Complete by: As directed    Do not drive 7  days post op   Increase activity slowly   Complete by: As directed    Leave dressing on - Keep it clean, dry, and intact until clinic visit   Complete by: As directed    Lifting restrictions   Complete by: As directed    No more than 20 pounds for 6 weeks   Sexual Activity Restrictions   Complete by: As directed    No sex for 6 weeks      Allergies as of 02/09/2023   No Active Allergies      Medication List     STOP taking these medications    Bonjesta 20-20 MG Tbcr Generic drug: Doxylamine-Pyridoxine ER   Butalbital-APAP-Caffeine 50-325-40 MG capsule       TAKE these medications    aspirin EC 81 MG tablet Take 1 tablet (81 mg total) by mouth daily. Swallow whole.   Blood Pressure Monitor Kit 1 Device by Does not apply route once a week. To be monitored Regularly at home.   cyclobenzaprine 10 MG tablet Commonly known as: FLEXERIL Take 1 tablet (10 mg total) by mouth every 8 (eight) hours as needed for muscle spasms.   furosemide 20 MG tablet Commonly known as: LASIX Take 1 tablet (20 mg total) by mouth 2 (two) times daily.   ibuprofen 600 MG tablet Commonly known as: ADVIL Take 1 tablet (600 mg total) by mouth every 6 (six) hours as needed for mild pain, headache, fever or cramping.   NIFEdipine 30 MG 24 hr tablet Commonly known as: ADALAT CC Take 1 tablet (30  mg total) by mouth daily.   oxyCODONE 5 MG immediate release tablet Commonly known as: Oxy IR/ROXICODONE Take 1-2 tablets (5-10 mg total) by mouth every 4 (four) hours as needed for moderate pain.   Prenatal 28-0.8 MG Tabs Take 1 tablet by mouth daily.               Discharge Care Instructions  (From admission, onward)           Start     Ordered   02/09/23 0000  Leave dressing on - Keep it clean, dry, and intact until clinic visit        02/09/23 0739            Follow-up Information     [facility] for [facility] Follow up on 02/16/2023.    Specialty: Obstetrics and Gynecology Why: BP incision check Contact information: [address] [place] [address] [phone]                Signed: Lazaro Arms 02/09/2023, 7:43 AM

## 2023-02-12 NOTE — Clinical Social Work Maternal (Addendum)
CLINICAL SOCIAL WORK MATERNAL/CHILD NOTE  Patient Details  Name: Susan [name] MRN: [id number] Date of Birth: [birth year]  Date:  02/12/2023  Clinical Social Worker Initiating Note:  Blaine Hamper Date/Time: Initiated:  02/12/23/1325     Child's Name:  Susan [name]   Biological Parents:  Mother, Father (FOB is Imontis [name] 04/29/1999 [phone])   Need for Interpreter:  None   Reason for Referral:  Parental Support of Premature Babies < 32 weeks/or Critically Ill babies   Address:  [address] [place] [address]    Phone number:  [phone] (home)     Additional phone number:   Household Members/Support Persons (HM/SP):    (Per MOB, she resides with ther mother and 2 brothers.)   HM/SP Name Relationship DOB or Age  HM/SP -1        HM/SP -2        HM/SP -3        HM/SP -4        HM/SP -5        HM/SP -6        HM/SP -7        HM/SP -8          Natural Supports (not living in the home):  Immediate Family, Extended Family, Parent, Spouse/significant other (The couple communciated that FOB's family will also provide supports when needed.)   Professional Supports: None   Employment: Unemployed   Type of Work:     Education:  Engineer, agricultural   Homebound arranged:    Surveyor, quantity Resources:  Medicaid   Other Resources:   (MOB communicated she plans to apply for Sales executive and WIC post discharge.)   Cultural/Religious Considerations Which May Impact Care:  Per [employer], MOB is Curator.   Strengths:  Ability to meet basic needs  , Pediatrician chosen, Home prepared for child     Psychotropic Medications:         Pediatrician:    [place]  Pediatrician List:   [facility]  [facility]      Pediatrician Fax Number:    Risk Factors/Current Problems:  None   Cognitive State:  Alert  , Able to Concentrate  , Linear Thinking  , Insightful      Mood/Affect:  Happy  , Bright  , Relaxed  , Comfortable  , Interested     CSW Assessment: CSW met with MOB in 101 to introduce myself, offer support and complete assessment due to NICU admission at 31.3 weeks.  MOB was pleasant and receptive to CSW intervention.  She reports feeling well today and seems to have a good understanding of baby's medical situation at this time as she was able to update CSW on infant's status.   CSW asked MOB to share her story of labor and delivery as well as baby's admission to NICU and how she felt emotionally throughout her experience.  MOB was open to talking with CSW and sharing her feelings.  She states baby's admission to NICU was "A little scary,"  but she knows it was necessary. CSW assisted her in identifying strengths, which she was able to.  She is pleased with baby's progress.   CSW provided education regarding PPD signs and symptoms to watch for and asked that MOB commit to talking with CSW and or her doctor if symptoms arise at any  time.  She agreed.  CSW also discussed common emotions often experienced during the first two weeks after delivery, keeping in mind the separation that is inevitably caused by baby's admission to NICU. MOB shared feeling well informed by NICU team and she denied having any questions or concerns.  MOB reports having all essential items to care for infant post discharge including a new car seat and beds.   CSW will continue to offer resources and supports to family while infant remains in NICU.    CSW Plan/Description:  Psychosocial Support and Ongoing Assessment of Needs, Sudden Infant Death Syndrome (SIDS) Education, Perinatal Mood and Anxiety Disorder (PMADs) Education, Other Information/Referral to [employer], MSW, [employer] Clinical Social Work [phone]   Barbara Cower, LCSW 02/12/2023, 1:30 PM

## 2023-02-13 ENCOUNTER — Encounter: Payer: Medicaid Other | Admitting: Obstetrics and Gynecology

## 2023-02-13 ENCOUNTER — Ambulatory Visit (INDEPENDENT_AMBULATORY_CARE_PROVIDER_SITE_OTHER): Payer: Medicaid Other

## 2023-02-13 ENCOUNTER — Other Ambulatory Visit: Payer: Self-pay | Admitting: Obstetrics and Gynecology

## 2023-02-13 VITALS — BP 135/85 | HR 87 | Wt 197.0 lb

## 2023-02-13 DIAGNOSIS — Z98891 History of uterine scar from previous surgery: Secondary | ICD-10-CM

## 2023-02-13 DIAGNOSIS — O1413 Severe pre-eclampsia, third trimester: Secondary | ICD-10-CM

## 2023-02-13 DIAGNOSIS — Z0130 Encounter for examination of blood pressure without abnormal findings: Secondary | ICD-10-CM

## 2023-02-13 NOTE — Progress Notes (Signed)
Subjective:  Susan [name] is a 22 y.o. female here for BP check and incision check.  Hypertension ROS: taking medications as instructed, no medication side effects noted, no TIA's, no chest pain on exertion, and no dyspnea on exertion.   Pt reports incision healing well  Objective:  LMP 07/01/2022 (Exact Date)   Appearance alert, well appearing, and in no distress. Incision healing well   Assessment:   Blood Pressure today in office stable.  Incision healed nicely, no signs of infection, scar looks good  Plan:  Keep post partum appt discussed sx's/ precautions with B/P pt advised if any signs of infection at incision area to contact the office.   Marland Kitchen

## 2023-02-14 ENCOUNTER — Ambulatory Visit (HOSPITAL_COMMUNITY): Payer: Self-pay

## 2023-02-14 NOTE — Lactation Note (Signed)
This note was copied from a baby's chart.  NICU Lactation Consultation Note  Patient Name: Susan [name] Date: 02/14/2023 Age:23 days  Reason for consult: Follow-up assessment; Primapara; 1st time breastfeeding; Mother's request; NICU baby; Infant < 6lbs; Preterm <34wks; RN request  SUBJECTIVE Visited with family of 18 28/4 weeks old AGA NICU female; Ms. Ravens is a P1 and called out for lactation because she had a questions regarding medications and breastmilk. She's still taking oxycodone at 8 days post-partum due to her C/S pain and wanted to make sure it's OK to take while pumping. Explained that oxycodone is an L3 and most likely compatible according to Hale's medications and mother's milk. She reports she's been pumping but not consistently, she only pumps when she comes to visit baby at the hospital; noticed that her supply has decreased. She doesn't have a pump at home, the [facility] office has reached out to her yet, therefore she's only pumping while at the hospital. Offered a [facility] loaner pump from the hospital in the meantime but she politely declined. Explained the importance of consistent pumping to protect her supply and offered a hand pump, she agreed to take it. Reviewed hand pump and DEBP settings, pumping schedule, strategies to increase supply and anticipatory guidelines.  OBJECTIVE Infant data: Mother's Current Feeding Choice: Breast Milk  Infant feeding assessment No data recorded  Maternal data: [id number]  C-Section, Low Transverse Pumping frequency: 4 times/24 hours Pumped volume: 60 mL (60-120 ml) Flange Size: 21  WIC Program: No WIC Referral Sent?: Yes What county?: [place] Pump: Manual (WIC office @  county still hasn't reached out to her, referral sent on 02/07/2023)  ASSESSMENT Infant: Feeding Status: Scheduled 9-12-3-6  Maternal: Milk volume: Low  INTERVENTIONS/PLAN Interventions: Interventions: Breast feeding basics reviewed; DEBP;  Education Tools: Pump; Hands-free pumping top; Flanges (Medium) Pump Education: Setup, frequency, and cleaning; Milk Storage  Plan: Encouraged to start pumping every 3 hours on maintenance mode, ideally 8 pumping sessions/24 hours She'll power pump in the AM She'll call the [facility] office to pick up her pump ASAP Parents will try doing as much STS care as they can She'll call for assistance when baby is ready to go to breast   FOB present. All questions and concerns answered, family to contact [facility] services PRN.  Consult Status: NICU follow-up NICU Follow-up type: Weekly NICU follow up   Cormac Wint S Philis Nettle 02/14/2023, 5:02 PM

## 2023-02-20 ENCOUNTER — Other Ambulatory Visit: Payer: Medicaid Other

## 2023-02-20 ENCOUNTER — Ambulatory Visit: Payer: Medicaid Other

## 2023-02-25 ENCOUNTER — Ambulatory Visit (HOSPITAL_COMMUNITY): Payer: Self-pay

## 2023-02-25 NOTE — Lactation Note (Signed)
This note was copied from a baby'[name] chart.  NICU Lactation Consultation Note  Patient Name: Susan [name] ZOXWR'U Date: 02/25/2023 Age:23 wk.o.  Reason for consult: Weekly NICU follow-up; Primapara; 1st time breastfeeding; NICU baby; Late-preterm 34-36.6wks; Infant < 6lbs  SUBJECTIVE Visited with family of 50 1/7 AGA NICU female; Ms. Susan [name] is a P1 and reports she'[name] no longer pumping. She voiced she followed all the recommendations from [facility] about pumping consistently and power pumping but her supply just didn't come back to what it used to be. She has decided to transition baby to formula once she'[name] no longer on donor milk. She had some questions regarding this transition, discussed breastmilk feedings vs. formula feedings and let her know that the NICU team will discuss this with her at length. Encouraged to pump for comfort or ice her breast in case they start feeling uncomfortable, she started leaking today but no pain/discomfort yet; just the nipples due to power pumping. FOB present but asleep. All questions and concerns answered, family to contact [name] services PRN.  OBJECTIVE Infant data: Mother'[name] Current Feeding Choice: -- (Donor breast milk)  Infant feeding assessment Scale for Readiness: 2   Maternal data: [id number]  C-Section, Low Transverse Pumping frequency: Last time she pumped was yesterday Pumped volume: 3 mL Flange Size: 21  WIC Program: No WIC Referral Sent?: Yes What county?: [place] Pump: Manual (WIC office @ [place] still hasn't reached out to her, referral sent on 02/07/2023)  ASSESSMENT Infant: Feeding Status: Scheduled 9-12-3-6  Maternal: Milk volume: Low  INTERVENTIONS/PLAN Interventions: Interventions: Education Discharge Education: Engorgement and breast care Tools: Pump; Flanges; Hands-free pumping top (Medium) Pump Education: Setup, frequency, and cleaning; Milk Storage  Plan: Consult Status: Complete   Susan [name]  Susan [name] 02/25/2023, 4:52 PM

## 2023-02-27 ENCOUNTER — Encounter: Payer: Medicaid Other | Admitting: Obstetrics & Gynecology

## 2023-03-13 ENCOUNTER — Encounter: Payer: Medicaid Other | Admitting: Obstetrics and Gynecology

## 2023-03-20 ENCOUNTER — Encounter: Payer: Medicaid Other | Admitting: Obstetrics and Gynecology

## 2023-03-20 ENCOUNTER — Encounter: Payer: Self-pay | Admitting: Obstetrics and Gynecology

## 2023-03-20 ENCOUNTER — Ambulatory Visit (INDEPENDENT_AMBULATORY_CARE_PROVIDER_SITE_OTHER): Payer: Medicaid Other | Admitting: Obstetrics and Gynecology

## 2023-03-20 ENCOUNTER — Other Ambulatory Visit (HOSPITAL_COMMUNITY)
Admission: RE | Admit: 2023-03-20 | Discharge: 2023-03-20 | Disposition: A | Discharge status: Home / Self Care | Payer: Medicaid Other | Source: Ambulatory Visit | Attending: Obstetrics and Gynecology | Admitting: Obstetrics and Gynecology

## 2023-03-20 VITALS — BP 121/79 | HR 103 | Wt 172.0 lb

## 2023-03-20 DIAGNOSIS — R7989 Other specified abnormal findings of blood chemistry: Secondary | ICD-10-CM

## 2023-03-20 DIAGNOSIS — R87612 Low grade squamous intraepithelial lesion on cytologic smear of cervix (LGSIL): Secondary | ICD-10-CM | POA: Insufficient documentation

## 2023-03-20 NOTE — Progress Notes (Unsigned)
  CC: Feet still swelling randomly   Post Partum Visit Note  Susan [name] is a 22 y.o. [id number] s/p 5/14 PLTCS due to NRFHTs after severe pre-eclampsia IOL at 31/3 weeks.Preg c/b FGR, LSIL pap.     Anesthesia: spinal. PP course has been uncomplicated.  Baby is doing well in the NICU Baby is feeding by bottle - Similac Advance. Bleeding no bleeding, but she has had her period. Bowel function is normal. Bladder function is normal. Patient is sexually active. Contraception method is Nexplanon prior to discharge. Postpartum depression screening: negative.   Edinburgh Postnatal Depression Scale - 03/20/23 1611       Edinburgh Postnatal Depression Scale:  In the Past 7 Days   I have been able to laugh and see the funny side of things. 0    I have looked forward with enjoyment to things. 0    I have blamed myself unnecessarily when things went wrong. 0    I have been anxious or worried for no good reason. 1    I have felt scared or panicky for no good reason. 0    Things have been getting on top of me. 0    I have been so unhappy that I have had difficulty sleeping. 0    I have felt sad or miserable. 0    I have been so unhappy that I have been crying. 0    The thought of harming myself has occurred to me. 0    Edinburgh Postnatal Depression Scale Total 1             Review of Systems Pertinent items noted in HPI and remainder of comprehensive ROS otherwise negative.  Objective:  BP 121/79   Pulse (!) 103   Wt 172 lb (78 kg)   LMP 03/11/2023 (Exact Date)   Breastfeeding No   BMI 26.15 kg/m    General: NAD Abdomen: incision c/d/I, nttp, nd Pelvic exam: normal external genitalia, vulva, vagina, cervix, uterus and adnexa, VULVA: normal appearing vulva with no masses, tenderness or lesions, VAGINA: normal appearing vagina with normal color and scant brown discharge, but no blood or lesions, CERVIX: normal appearing cervix without discharge or lesions, UTERUS: uterus is normal size,  shape, consistency and nontender, ADNEXA: normal adnexa in size, nontender and no masses, exam chaperoned by RN.  Assessment:   Normal PP exam  Plan:  *PP: routine care *LSIL pap: repeat today *Severe pre-eclampsia: pt has been off bp meds for >1wk. Repeat bmp today to check Cr.   RTC PRN  [place] Bing, MD Center for [employer], [facility]

## 2023-03-21 ENCOUNTER — Encounter: Payer: Self-pay | Admitting: Obstetrics and Gynecology

## 2023-03-22 ENCOUNTER — Encounter: Payer: Self-pay | Admitting: Obstetrics and Gynecology

## 2023-03-22 LAB — BASIC METABOLIC PANEL
BUN/Creatinine Ratio: 7 — ABNORMAL LOW (ref 9–23)
BUN: 7 mg/dL (ref 6–20)
CO2: 22 mmol/L (ref 20–29)
Calcium: 9.1 mg/dL (ref 8.7–10.2)
Chloride: 105 mmol/L (ref 96–106)
Creatinine, Ser: 0.94 mg/dL (ref 0.57–1.00)
Glucose: 83 mg/dL (ref 70–99)
Potassium: 4.1 mmol/L (ref 3.5–5.2)
Sodium: 141 mmol/L (ref 134–144)
eGFR: 88 mL/min/{1.73_m2} (ref >59)

## 2023-03-27 LAB — CYTOLOGY - PAP
Comment: NEGATIVE
Diagnosis: UNDETERMINED — AB
High risk HPV: NEGATIVE

## 2023-05-14 ENCOUNTER — Telehealth: Payer: Self-pay | Admitting: Licensed Clinical Social Worker

## 2023-05-14 NOTE — Telephone Encounter (Signed)
Referred from Robin for reported mild postpartum depressive symptoms and anxiety symptoms.

## 2023-05-24 ENCOUNTER — Telehealth: Payer: Self-pay | Admitting: Licensed Clinical Social Worker

## 2023-05-24 NOTE — Telephone Encounter (Signed)
Re-referred due to contact info. By Robin [place], RN [phone].

## 2023-07-01 ENCOUNTER — Other Ambulatory Visit (HOSPITAL_COMMUNITY): Payer: Self-pay

## 2023-08-07 ENCOUNTER — Ambulatory Visit: Payer: Medicaid Other | Admitting: Nurse Practitioner

## 2023-08-08 ENCOUNTER — Ambulatory Visit: Payer: Medicaid Other | Admitting: Nurse Practitioner

## 2023-08-08 ENCOUNTER — Encounter: Payer: Self-pay | Admitting: Nurse Practitioner

## 2023-08-08 VITALS — BP 112/70 | HR 94 | Temp 98.2°F | Resp 16 | Ht 68.0 in | Wt 168.6 lb

## 2023-08-08 DIAGNOSIS — R7301 Impaired fasting glucose: Secondary | ICD-10-CM

## 2023-08-08 DIAGNOSIS — Z8759 Personal history of other complications of pregnancy, childbirth and the puerperium: Secondary | ICD-10-CM

## 2023-08-08 NOTE — Progress Notes (Unsigned)
[facility] [address] [place], [place] [address]  Internal MEDICINE  Office Visit Note  Patient Name: Susan [name]  [id number]  [id number]  Date of Service: 08/08/2023   Complaints/HPI Pt is here for establishment of PCP. Chief Complaint  Patient presents with   New Patient (Initial Visit)    Check up    HPI Susan [name] presents for a new patient visit to establish care.  Well-appearing 22 y.o.female with no significant medical conditions.history of preeclampsia.  Surgical history significant for C-section  Not breastfeeding.  Work: not right now  Home: lives with boyfriend x5 years  Diet: normal  Exercise: planks, situps, squats, calisthenics. Home exercies  Tobacco use: none  Alcohol use: once a month Illicit drug use: none  Pap smear: done in June 2024, due in 3 years  Labs:  up to date  New or worsening pain: none   Current Medication: Outpatient Encounter Medications as of 08/08/2023  Medication Sig   Prenatal 28-0.8 MG TABS Take 1 tablet by mouth daily.   Blood Pressure Monitor KIT 1 Device by Does not apply route once a week. To be monitored Regularly at home. (Patient not taking: Reported on 03/20/2023)   cyclobenzaprine (FLEXERIL) 10 MG tablet Take 1 tablet (10 mg total) by mouth every 8 (eight) hours as needed for muscle spasms. (Patient not taking: Reported on 01/09/2023)   furosemide (LASIX) 20 MG tablet Take 1 tablet (20 mg total) by mouth 2 (two) times daily for 3 days.   ibuprofen (ADVIL) 600 MG tablet Take 1 tablet (600 mg total) by mouth every 6 (six) hours as needed for mild pain, headache, fever or cramping. (Patient not taking: Reported on 03/20/2023)   NIFEdipine (ADALAT CC) 30 MG 24 hr tablet Take 1 tablet (30 mg total) by mouth daily. (Patient not taking: Reported on 03/20/2023)   oxyCODONE (OXY IR/ROXICODONE) 5 MG immediate release tablet Take 1-2 tablets (5-10 mg total) by mouth every 4 (four) hours as needed for moderate pain. (Patient  not taking: Reported on 03/20/2023)   No facility-administered encounter medications on file as of 08/08/2023.    Surgical History: Past Surgical History:  Procedure Laterality Date   CESAREAN SECTION N/A 02/06/2023   Procedure: CESAREAN SECTION;  Surgeon: Lennart Pall, MD;  Location: [facility] LD ORS;  Service: Obstetrics;  Laterality: N/A;   INSERTION OF IMPLANON ROD  02/08/2023   NO PAST SURGERIES      Medical History: Past Medical History:  Diagnosis Date   AKI (acute kidney injury) (HCC) 02/08/2023   From severe pre-eclamspia.    Anemia    Severe preeclampsia, third trimester 01/30/2023    Family History: Family History  Problem Relation Age of Onset   Hyperlipidemia Paternal Grandmother    Hypertension Paternal Grandmother    Cancer Paternal Grandmother    Heart disease Paternal Grandmother    Thyroid disease Maternal Grandmother    Cancer Maternal Grandmother    Heart disease Maternal Grandfather    Epilepsy Maternal Grandfather    High Cholesterol Maternal Grandfather    Stroke Maternal Grandfather    Hypertension Maternal Grandfather    Heart disease Father    Hypertension Father    Cancer Mother 23       ovarian cancer   Asthma Mother    Hypertension Mother    Epilepsy Mother    High Cholesterol Mother     Social History   Socioeconomic History   Marital status: Single    Spouse name:  Imontios   Number of children: Not on file   Years of education: Not on file   Highest education level: Not on file  Occupational History   Occupation: [employer]s  Tobacco Use   Smoking status: Some Days    Types: E-cigarettes   Smokeless tobacco: Never  Vaping Use   Vaping status: Never Used  Substance and Sexual Activity   Alcohol use: Yes    Comment: Occ.   Drug use: No   Sexual activity: Yes  Other Topics Concern   Not on file  Social History Narrative   Not on file   Social Determinants of Health   Financial Resource Strain: Not on file  Food  Insecurity: No Food Insecurity (02/05/2023)   Hunger Vital Sign    Worried About Running Out of Food in the Last Year: Never true    Ran Out of Food in the Last Year: Never true  Transportation Needs: No Transportation Needs (02/05/2023)   PRAPARE - Administrator, Civil Service (Medical): No    Lack of Transportation (Non-Medical): No  Physical Activity: Not on file  Stress: Not on file  Social Connections: Not on file  Intimate Partner Violence: Not At Risk (02/05/2023)   Humiliation, Afraid, Rape, and Kick questionnaire    Fear of Current or Ex-Partner: No    Emotionally Abused: No    Physically Abused: No    Sexually Abused: No     Review of Systems  Vital Signs: BP 112/70   Pulse 94   Temp 98.2 F (36.8 C)   Resp 16   Ht 5\' 8"  (1.727 m)   Wt 168 lb 9.6 oz (76.5 kg)   SpO2 97%   BMI 25.64 kg/m    Physical Exam    Assessment/Plan: There are no diagnoses linked to this encounter.   General Counseling: Susan [name] verbalizes understanding of the findings of todays visit and agrees with plan of treatment. I have discussed any further diagnostic evaluation that may be needed or ordered today. We also reviewed her medications today. she has been encouraged to call the office with any questions or concerns that should arise related to todays visit.    No orders of the defined types were placed in this encounter.   No orders of the defined types were placed in this encounter.   No follow-ups on file.  Time spent:*** Minutes Time spent with patient included reviewing progress notes, labs, imaging studies, and discussing plan for follow up.   [place] Controlled Substance Database was reviewed by me for overdose risk score (ORS)   This patient was seen by Sallyanne Kuster, FNP-C in collaboration with Dr. Beverely Risen as a part of collaborative care agreement.   Jyron Turman R. Tedd Sias, MSN, FNP-C Internal Medicine

## 2023-08-15 ENCOUNTER — Telehealth: Payer: Self-pay | Admitting: Nurse Practitioner

## 2023-08-15 NOTE — Telephone Encounter (Signed)
Left vm and sent mychart message to confirm 08/22/23 appointment-Toni

## 2023-08-16 ENCOUNTER — Telehealth: Payer: Self-pay | Admitting: Nurse Practitioner

## 2023-08-16 NOTE — Telephone Encounter (Signed)
Received request from [facility] for 08/08/23 office notes, medication list and next appointment date and time. Awaiting for 08/08/23 office notes to be closed-Toni

## 2023-08-22 ENCOUNTER — Encounter: Payer: Medicaid Other | Admitting: Nurse Practitioner

## 2023-08-30 ENCOUNTER — Other Ambulatory Visit: Payer: Self-pay | Admitting: Family Medicine

## 2023-08-30 ENCOUNTER — Other Ambulatory Visit: Payer: Self-pay | Admitting: *Deleted

## 2023-08-30 MED ORDER — FLUCONAZOLE 150 MG PO TABS: 150.0000 mg | ORAL_TABLET | Freq: Once | ORAL | 1 refills | Status: AC

## 2023-09-20 ENCOUNTER — Encounter: Payer: Medicaid Other | Admitting: Nurse Practitioner

## 2023-09-28 ENCOUNTER — Telehealth: Payer: Self-pay | Admitting: Nurse Practitioner

## 2023-09-28 ENCOUNTER — Encounter: Payer: Self-pay | Admitting: Nurse Practitioner

## 2023-09-28 NOTE — Telephone Encounter (Signed)
 08/08/23 office note, med list and upcoming appointment faxed to [facility]; [phone]

## 2023-10-03 ENCOUNTER — Telehealth: Payer: Self-pay | Admitting: Nurse Practitioner

## 2023-10-03 NOTE — Telephone Encounter (Signed)
 Left vm and sent mychart message to confirm 10/10/23 appointment-Toni

## 2023-10-10 ENCOUNTER — Encounter: Payer: Medicaid Other | Admitting: Nurse Practitioner

## 2023-10-24 ENCOUNTER — Telehealth: Payer: Self-pay | Admitting: Nurse Practitioner

## 2023-10-24 NOTE — Telephone Encounter (Signed)
Left vm and sent mychart message to confirm 10/31/23 appointment-Toni

## 2023-10-31 ENCOUNTER — Encounter: Payer: Medicaid Other | Admitting: Nurse Practitioner

## 2023-11-06 ENCOUNTER — Encounter: Payer: Medicaid Other | Admitting: Nurse Practitioner

## 2023-11-13 ENCOUNTER — Telehealth: Payer: Self-pay | Admitting: Nurse Practitioner

## 2023-11-13 NOTE — Telephone Encounter (Signed)
Lvm & sent mychart msg to change 11/15/23 appointment to virtual-Toni

## 2023-11-15 ENCOUNTER — Encounter: Payer: Medicaid Other | Admitting: Nurse Practitioner

## 2023-11-29 ENCOUNTER — Ambulatory Visit: Payer: Medicaid Other | Admitting: Nurse Practitioner

## 2023-11-29 ENCOUNTER — Encounter: Payer: Self-pay | Admitting: Nurse Practitioner

## 2023-11-29 VITALS — BP 120/74 | HR 91 | Temp 98.1°F | Resp 16 | Ht 68.0 in | Wt 164.4 lb

## 2023-11-29 DIAGNOSIS — R5383 Other fatigue: Secondary | ICD-10-CM

## 2023-11-29 DIAGNOSIS — Z8759 Personal history of other complications of pregnancy, childbirth and the puerperium: Secondary | ICD-10-CM

## 2023-11-29 DIAGNOSIS — Z0001 Encounter for general adult medical examination with abnormal findings: Secondary | ICD-10-CM | POA: Diagnosis not present

## 2023-11-29 DIAGNOSIS — Z975 Presence of (intrauterine) contraceptive device: Secondary | ICD-10-CM

## 2023-11-29 NOTE — Progress Notes (Signed)
 [facility] [address] [place], [place] [address]  Internal MEDICINE  Office Visit Note  Patient Name: Susan [name]  [id number]  [id number]  Date of Service: 11/29/2023  Chief Complaint  Patient presents with   Hypertension   Annual Exam    HPI [name] presents for an annual well visit and physical exam.  Well-appearing 23 y.o. female with no significant medical problems. She has history of pre-eclampsia during recent pregnancy and has a nexplanon implant in place.  Pap smear: sees gynecology  Labs: deferred  New or worsening pain: none  Other concerns: none     Current Medication: Outpatient Encounter Medications as of 11/29/2023  Medication Sig   Prenatal 28-0.8 MG TABS Take 1 tablet by mouth daily.   No facility-administered encounter medications on file as of 11/29/2023.    Surgical History: Past Surgical History:  Procedure Laterality Date   CESAREAN SECTION N/A 02/06/2023   Procedure: CESAREAN SECTION;  Surgeon: Lennart Pall, MD;  Location: [facility] LD ORS;  Service: Obstetrics;  Laterality: N/A;   INSERTION OF IMPLANON ROD  02/08/2023   NO PAST SURGERIES      Medical History: Past Medical History:  Diagnosis Date   AKI (acute kidney injury) (HCC) 02/08/2023   From severe pre-eclamspia.    Anemia    Severe preeclampsia, third trimester 01/30/2023    Family History: Family History  Problem Relation Age of Onset   Hyperlipidemia Paternal Grandmother    Hypertension Paternal Grandmother    Cancer Paternal Grandmother    Heart disease Paternal Grandmother    Thyroid disease Maternal Grandmother    Cancer Maternal Grandmother    Heart disease Maternal Grandfather    Epilepsy Maternal Grandfather    High Cholesterol Maternal Grandfather    Stroke Maternal Grandfather    Hypertension Maternal Grandfather    Heart disease Father    Hypertension Father    Cancer Mother 29       ovarian cancer   Asthma Mother    Hypertension Mother     Epilepsy Mother    High Cholesterol Mother     Social History   Socioeconomic History   Marital status: Single    Spouse name: Imontios   Number of children: Not on file   Years of education: Not on file   Highest education level: Not on file  Occupational History   Occupation: [employer]s  Tobacco Use   Smoking status: Some Days    Types: E-cigarettes   Smokeless tobacco: Never  Vaping Use   Vaping status: Never Used  Substance and Sexual Activity   Alcohol use: Yes    Comment: Occ.   Drug use: No   Sexual activity: Yes  Other Topics Concern   Not on file  Social History Narrative   Not on file   Social Drivers of Health   Financial Resource Strain: Not on file  Food Insecurity: No Food Insecurity (02/05/2023)   Hunger Vital Sign    Worried About Running Out of Food in the Last Year: Never true    Ran Out of Food in the Last Year: Never true  Transportation Needs: No Transportation Needs (02/05/2023)   PRAPARE - Administrator, Civil Service (Medical): No    Lack of Transportation (Non-Medical): No  Physical Activity: Not on file  Stress: Not on file  Social Connections: Not on file  Intimate Partner Violence: Not At Risk (02/05/2023)   Humiliation, Afraid, Rape, and Kick questionnaire  Fear of Current or Ex-Partner: No    Emotionally Abused: No    Physically Abused: No    Sexually Abused: No      Review of Systems  Constitutional:  Positive for fatigue. Negative for activity change, appetite change, chills, fever and unexpected weight change.  HENT: Negative.  Negative for congestion, ear pain, rhinorrhea, sore throat and trouble swallowing.   Eyes: Negative.   Respiratory: Negative.  Negative for cough, chest tightness, shortness of breath and wheezing.   Cardiovascular: Negative.  Negative for chest pain.  Gastrointestinal: Negative.  Negative for abdominal pain, blood in stool, constipation, diarrhea, nausea and vomiting.  Endocrine:  Negative.   Genitourinary: Negative.  Negative for difficulty urinating, dysuria, frequency, hematuria and urgency.  Musculoskeletal: Negative.  Negative for arthralgias, back pain, joint swelling, myalgias and neck pain.  Skin: Negative.  Negative for rash and wound.  Allergic/Immunologic: Negative.  Negative for immunocompromised state.  Neurological: Negative.  Negative for dizziness, seizures, numbness and headaches.  Hematological: Negative.   Psychiatric/Behavioral: Negative.  Negative for behavioral problems, self-injury and suicidal ideas. The patient is not nervous/anxious.     Vital Signs: BP 120/74   Pulse 91   Temp 98.1 F (36.7 C)   Resp 16   Ht 5\' 8"  (1.727 m)   Wt 164 lb 6.4 oz (74.6 kg)   SpO2 99%   BMI 25.00 kg/m    Physical Exam Vitals reviewed.  Constitutional:      General: She is not in acute distress.    Appearance: Normal appearance. She is well-developed and normal weight. She is not ill-appearing or diaphoretic.  HENT:     Head: Normocephalic and atraumatic.     Right Ear: Tympanic membrane, ear canal and external ear normal.     Left Ear: Tympanic membrane, ear canal and external ear normal.     Nose: Nose normal. No congestion or rhinorrhea.     Mouth/Throat:     Mouth: Mucous membranes are moist.     Pharynx: Oropharynx is clear. No oropharyngeal exudate or posterior oropharyngeal erythema.  Eyes:     General: No scleral icterus.       Right eye: No discharge.        Left eye: No discharge.     Conjunctiva/sclera: Conjunctivae normal.     Pupils: Pupils are equal, round, and reactive to light.  Neck:     Thyroid: No thyromegaly.     Vascular: No JVD.     Trachea: No tracheal deviation.  Cardiovascular:     Rate and Rhythm: Normal rate and regular rhythm.     Pulses: Normal pulses.     Heart sounds: Normal heart sounds. No murmur heard.    No friction rub. No gallop.  Pulmonary:     Effort: Pulmonary effort is normal. No respiratory  distress.     Breath sounds: Normal breath sounds. No stridor. No wheezing or rales.  Chest:     Chest wall: No tenderness.  Abdominal:     General: Bowel sounds are normal. There is no distension.     Palpations: Abdomen is soft. There is no mass.     Tenderness: There is no abdominal tenderness. There is no guarding or rebound.  Musculoskeletal:        General: No tenderness or deformity. Normal range of motion.     Cervical back: Normal range of motion and neck supple.  Lymphadenopathy:     Cervical: No cervical adenopathy.  Skin:  General: Skin is warm and dry.     Capillary Refill: Capillary refill takes less than 2 seconds.     Coloration: Skin is not pale.     Findings: No erythema or rash.  Neurological:     Mental Status: She is alert and oriented to person, place, and time.     Cranial Nerves: No cranial nerve deficit.     Motor: No abnormal muscle tone.     Coordination: Coordination normal.     Gait: Gait normal.     Deep Tendon Reflexes: Reflexes are normal and symmetric.  Psychiatric:        Mood and Affect: Mood normal.        Behavior: Behavior normal.        Thought Content: Thought content normal.        Judgment: Judgment normal.        Assessment/Plan: 1. Encounter for routine adult health examination with abnormal findings (Primary) Age-appropriate preventive screenings and vaccinations discussed, annual physical exam completed. Routine labs for health maintenance deferred. PHM updated.    2. Other fatigue Taking care of small child, typical fatigue   3. History of pre-eclampsia No issues with BP since childbirth  3. Nexplanon in place Monitored and replaced by her OBGYN      General Counseling: Shawnya verbalizes understanding of the findings of todays visit and agrees with plan of treatment. I have discussed any further diagnostic evaluation that may be needed or ordered today. We also reviewed her medications today. she has been  encouraged to call the office with any questions or concerns that should arise related to todays visit.    No orders of the defined types were placed in this encounter.   No orders of the defined types were placed in this encounter.   Return in about 1 year (around 11/28/2024) for CPE, Ladarrell Cornwall PCP and otherwise as needed. .   Total time spent:30 Minutes Time spent includes review of chart, medications, test results, and follow up plan with the patient.   [place] Controlled Substance Database was reviewed by me.  This patient was seen by Sallyanne Kuster, FNP-C in collaboration with Dr. Beverely Risen as a part of collaborative care agreement.  Jaxie Racanelli R. Tedd Sias, MSN, FNP-C Internal medicine

## 2023-12-15 ENCOUNTER — Encounter: Payer: Self-pay | Admitting: Nurse Practitioner

## 2024-06-18 ENCOUNTER — Ambulatory Visit: Admitting: Obstetrics and Gynecology

## 2024-07-02 ENCOUNTER — Ambulatory Visit: Admitting: Certified Nurse Midwife

## 2024-07-16 ENCOUNTER — Other Ambulatory Visit (HOSPITAL_COMMUNITY)
Admission: RE | Admit: 2024-07-16 | Discharge: 2024-07-16 | Disposition: A | Source: Ambulatory Visit | Attending: Obstetrics and Gynecology | Admitting: Obstetrics and Gynecology

## 2024-07-16 ENCOUNTER — Encounter: Payer: Self-pay | Admitting: Obstetrics and Gynecology

## 2024-07-16 ENCOUNTER — Ambulatory Visit: Admitting: Obstetrics and Gynecology

## 2024-07-16 VITALS — BP 114/74 | HR 76 | Ht 68.0 in | Wt 156.0 lb

## 2024-07-16 DIAGNOSIS — Z124 Encounter for screening for malignant neoplasm of cervix: Secondary | ICD-10-CM | POA: Diagnosis present

## 2024-07-16 DIAGNOSIS — Z01419 Encounter for gynecological examination (general) (routine) without abnormal findings: Secondary | ICD-10-CM | POA: Diagnosis not present

## 2024-07-16 DIAGNOSIS — K5900 Constipation, unspecified: Secondary | ICD-10-CM

## 2024-07-16 DIAGNOSIS — Z113 Encounter for screening for infections with a predominantly sexual mode of transmission: Secondary | ICD-10-CM

## 2024-07-16 MED ORDER — MAGNESIUM 200 MG PO TABS
400.0000 mg | ORAL_TABLET | Freq: Every day | ORAL | 1 refills | Status: AC
Start: 2024-07-16 — End: ?

## 2024-07-16 NOTE — Progress Notes (Signed)
 Patient presents for Annual. PHQ9=0 GAD7=0 LMP: 08/19-08/30 pt asked if this is normal not having cycle monthly w/ Nexplanon  notes having   Last pap: 03/20/2023 ASCUS  Contraception: Nexplanon  Mammogram: Not yet indicated Family Hx of Breast Cancer: Mom in 68's STD Screening: Accepts  Flu Vaccine : Declines

## 2024-07-16 NOTE — Progress Notes (Signed)
 WELL-WOMAN PHYSICAL & PAP Patient name: Susan [name] MRN [id number]  Date of birth: 05/14/2001 Chief Complaint:   Gynecologic Exam  History of Present Illness:   Susan [name] is a 23 y.o. G13P0111 African American female being seen today for a routine well-woman exam.  Current complaints: no period since August. Wondering if this is normal after having Nexplanon .  PCP: Liana Fish, NP       does desire labs Patient's last menstrual period was 06/13/2024. The current method of family planning is Nexplanon .  Last pap 03/20/2023. Results were: abnormal  ASCUS with Negative HPV Last mammogram: N/A. Family h/o breast cancer: Yes - mom in her 30s Last colonoscopy: N/A. Family h/o colorectal cancer: No Review of Systems:   Pertinent items are noted in HPI Denies any headaches, blurred vision, fatigue, shortness of breath, chest pain, abdominal pain, abnormal vaginal discharge/itching/odor/irritation, problems with periods, bowel movements, urination, or intercourse unless otherwise stated above. Pertinent History Reviewed:  Reviewed past medical,surgical, social and family history.  Reviewed problem list, medications and allergies. Physical Assessment:   Vitals:   07/16/24 0822  BP: 114/74  Pulse: 76  Weight: 156 lb (70.8 kg)  Height: 5' 8 (1.727 m)  Body mass index is 23.72 kg/m.        Physical Examination:   General appearance - well appearing, and in no distress  Mental status - alert, oriented to person, place, and time  Psych:  She has a normal mood and affect  Skin - warm and dry, normal color, no suspicious lesions noted  Chest - effort normal, all lung fields clear to auscultation bilaterally  Heart - normal rate and regular rhythm  Neck:  midline trachea, no thyromegaly or nodules  Breasts - breasts appear normal, no suspicious masses, no skin or nipple changes or  axillary nodes  Abdomen - soft, nontender, nondistended, no masses or organomegaly  Pelvic -  VULVA: normal appearing vulva with no masses, tenderness or lesions  VAGINA: normal appearing vagina with normal color and brown discharge, no lesions  CERVIX: normal appearing cervix without discharge or lesions, no CMT  Thin prep pap is done with reflex HR HPV cotesting  UTERUS: uterus is felt to be normal size, shape, consistency and nontender   ADNEXA: No adnexal masses or tenderness noted.  Rectal - deferred  Extremities:  No swelling or varicosities noted  No results found for this or any previous visit (from the past 24 hours).  Assessment & Plan:  1. Encounter for well woman exam with routine gynecological exam (Primary) - Cytology - PAP - Advised that the brown discharge is indicative of some uterine bleeding and that cycles may have changed to spotting monthly for now  2. Pap smear for cervical cancer screening - Cytology - PAP  3. Screening for STD (sexually transmitted disease) - RPR+HBsAg+HCVAb+...  4. Constipation, unspecified constipation type - Rx for Magnesium  200 MG TABS; Take 2 tablets (400 mg total) by mouth at bedtime.  Dispense: 30 tablet; Refill: 1  - Advised to take every day, unless stool becomes too soft/frequent. Then take every other day.  Labs/procedures today: pap and STI testing  Mammogram at age 9 or sooner if problems Colonoscopy at age 61 or sooner if problems  Orders Placed This Encounter  Procedures   RPR+HBsAg+HCVAb+...    Meds:  Meds ordered this encounter  Medications   Magnesium  200 MG TABS    Sig: Take 2 tablets (400 mg total) by mouth at bedtime.  Dispense:  30 tablet    Refill:  1    Supervising Provider:   PRATT, TANYA S [[id number]]    Follow-up: Return in about 1 year (around 07/16/2025) for Annual Exam.  Ala Cart MSN, CNM 07/16/2024 8:52 AM

## 2024-07-17 ENCOUNTER — Ambulatory Visit: Payer: Self-pay | Admitting: Obstetrics and Gynecology

## 2024-07-17 LAB — RPR+HBSAG+HCVAB+...
HIV Screen 4th Generation wRfx: NONREACTIVE
Hep C Virus Ab: NONREACTIVE
Hepatitis B Surface Ag: NEGATIVE
RPR Ser Ql: NONREACTIVE

## 2024-07-21 LAB — CYTOLOGY - PAP
Chlamydia: NEGATIVE
Comment: NEGATIVE
Comment: NEGATIVE
Comment: NEGATIVE
Comment: NORMAL
Diagnosis: UNDETERMINED — AB
High risk HPV: NEGATIVE
Neisseria Gonorrhea: NEGATIVE
Trichomonas: NEGATIVE

## 2024-07-23 ENCOUNTER — Encounter: Payer: Self-pay | Admitting: Obstetrics and Gynecology

## 2024-07-25 NOTE — Telephone Encounter (Signed)
 Called pt and went over PAP results and scheduled colpo on 12/1 with Dr DELENA

## 2024-07-25 NOTE — Telephone Encounter (Signed)
-----   Message from Ala Cart sent at 07/23/2024  2:07 PM EDT ----- Needs colposcopy scheduled with MD ----- Message ----- From: Rebecka Memos Lab Results In Sent: 07/17/2024   8:37 AM EDT To: Rolitta Dawson, CNM

## 2024-08-25 ENCOUNTER — Encounter: Admitting: Obstetrics & Gynecology

## 2024-09-02 ENCOUNTER — Encounter: Admitting: Family Medicine

## 2024-10-20 ENCOUNTER — Encounter: Admitting: Obstetrics & Gynecology

## 2024-11-20 ENCOUNTER — Encounter: Admitting: Obstetrics and Gynecology

## 2024-11-25 ENCOUNTER — Encounter: Admitting: Obstetrics & Gynecology

## 2024-12-01 ENCOUNTER — Encounter: Admitting: Nurse Practitioner
